# Patient Record
Sex: Female | Born: 1940 | Race: Black or African American | Hispanic: No | Marital: Single | Smoking: Never smoker
Health system: Southern US, Community
[De-identification: ages and names within clinical notes are randomized; demographics above are authoritative.]

## PROBLEM LIST (undated history)

## (undated) DIAGNOSIS — R079 Chest pain, unspecified: Secondary | ICD-10-CM

## (undated) DIAGNOSIS — F419 Anxiety disorder, unspecified: Secondary | ICD-10-CM

## (undated) DIAGNOSIS — F329 Major depressive disorder, single episode, unspecified: Secondary | ICD-10-CM

## (undated) DIAGNOSIS — I129 Hypertensive chronic kidney disease with stage 1 through stage 4 chronic kidney disease, or unspecified chronic kidney disease: Secondary | ICD-10-CM

## (undated) DIAGNOSIS — E785 Hyperlipidemia, unspecified: Secondary | ICD-10-CM

## (undated) DIAGNOSIS — N182 Chronic kidney disease, stage 2 (mild): Secondary | ICD-10-CM

## (undated) DIAGNOSIS — M810 Age-related osteoporosis without current pathological fracture: Secondary | ICD-10-CM

## (undated) DIAGNOSIS — N281 Cyst of kidney, acquired: Secondary | ICD-10-CM

## (undated) DIAGNOSIS — M8580 Other specified disorders of bone density and structure, unspecified site: Secondary | ICD-10-CM

## (undated) DIAGNOSIS — Z86718 Personal history of other venous thrombosis and embolism: Secondary | ICD-10-CM

## (undated) DIAGNOSIS — Z8709 Personal history of other diseases of the respiratory system: Secondary | ICD-10-CM

## (undated) DIAGNOSIS — F0390 Unspecified dementia without behavioral disturbance: Secondary | ICD-10-CM

## (undated) DIAGNOSIS — K5900 Constipation, unspecified: Secondary | ICD-10-CM

## (undated) DIAGNOSIS — R3915 Urgency of urination: Secondary | ICD-10-CM

## (undated) DIAGNOSIS — F32A Depression, unspecified: Secondary | ICD-10-CM

## (undated) DIAGNOSIS — M1990 Unspecified osteoarthritis, unspecified site: Secondary | ICD-10-CM

## (undated) DIAGNOSIS — M797 Fibromyalgia: Secondary | ICD-10-CM

## (undated) DIAGNOSIS — S2249XA Multiple fractures of ribs, unspecified side, initial encounter for closed fracture: Secondary | ICD-10-CM

## (undated) DIAGNOSIS — R310 Gross hematuria: Secondary | ICD-10-CM

## (undated) DIAGNOSIS — K649 Unspecified hemorrhoids: Secondary | ICD-10-CM

## (undated) DIAGNOSIS — C189 Malignant neoplasm of colon, unspecified: Secondary | ICD-10-CM

## (undated) DIAGNOSIS — E559 Vitamin D deficiency, unspecified: Secondary | ICD-10-CM

## (undated) DIAGNOSIS — H409 Unspecified glaucoma: Secondary | ICD-10-CM

## (undated) DIAGNOSIS — D649 Anemia, unspecified: Secondary | ICD-10-CM

## (undated) DIAGNOSIS — H9190 Unspecified hearing loss, unspecified ear: Secondary | ICD-10-CM

## (undated) DIAGNOSIS — M549 Dorsalgia, unspecified: Secondary | ICD-10-CM

## (undated) DIAGNOSIS — I700 Atherosclerosis of aorta: Secondary | ICD-10-CM

## (undated) DIAGNOSIS — I10 Essential (primary) hypertension: Secondary | ICD-10-CM

## (undated) DIAGNOSIS — Q631 Lobulated, fused and horseshoe kidney: Secondary | ICD-10-CM

## (undated) DIAGNOSIS — L659 Nonscarring hair loss, unspecified: Secondary | ICD-10-CM

## (undated) DIAGNOSIS — H547 Unspecified visual loss: Secondary | ICD-10-CM

## (undated) HISTORY — DX: Cyst of kidney, acquired: N28.1

## (undated) HISTORY — DX: Chronic kidney disease, stage 2 (mild): N18.2

## (undated) HISTORY — DX: Dorsalgia, unspecified: M54.9

## (undated) HISTORY — DX: Constipation, unspecified: K59.00

## (undated) HISTORY — DX: Urgency of urination: R39.15

## (undated) HISTORY — DX: Hypertensive chronic kidney disease with stage 1 through stage 4 chronic kidney disease, or unspecified chronic kidney disease: I12.9

## (undated) HISTORY — DX: Essential (primary) hypertension: I10

## (undated) HISTORY — DX: Gross hematuria: R31.0

## (undated) HISTORY — DX: Unspecified hemorrhoids: K64.9

## (undated) HISTORY — PX: BREAST SURGERY: SHX581

## (undated) HISTORY — DX: Unspecified hearing loss, unspecified ear: H91.90

## (undated) HISTORY — DX: Atherosclerosis of aorta: I70.0

## (undated) HISTORY — DX: Nonscarring hair loss, unspecified: L65.9

## (undated) HISTORY — DX: Major depressive disorder, single episode, unspecified: F32.9

## (undated) HISTORY — DX: Lobulated, fused and horseshoe kidney: Q63.1

## (undated) HISTORY — DX: Multiple fractures of ribs, unspecified side, initial encounter for closed fracture: S22.49XA

## (undated) HISTORY — PX: TOTAL ABDOMINAL HYSTERECTOMY: SHX209

## (undated) HISTORY — DX: Unspecified dementia, unspecified severity, without behavioral disturbance, psychotic disturbance, mood disturbance, and anxiety: F03.90

## (undated) HISTORY — DX: Personal history of other diseases of the respiratory system: Z87.09

## (undated) HISTORY — DX: Hyperlipidemia, unspecified: E78.5

## (undated) HISTORY — DX: Chest pain, unspecified: R07.9

## (undated) HISTORY — DX: Age-related osteoporosis without current pathological fracture: M81.0

## (undated) HISTORY — DX: Malignant neoplasm of colon, unspecified: C18.9

## (undated) HISTORY — DX: Unspecified glaucoma: H40.9

## (undated) HISTORY — PX: LUNG SURGERY: SHX703

## (undated) HISTORY — DX: Vitamin D deficiency, unspecified: E55.9

## (undated) HISTORY — DX: Anemia, unspecified: D64.9

## (undated) HISTORY — DX: Other specified disorders of bone density and structure, unspecified site: M85.80

## (undated) HISTORY — DX: Fibromyalgia: M79.7

## (undated) HISTORY — DX: Unspecified osteoarthritis, unspecified site: M19.90

## (undated) HISTORY — DX: Depression, unspecified: F32.A

## (undated) HISTORY — DX: Anxiety disorder, unspecified: F41.9

## (undated) HISTORY — DX: Personal history of other venous thrombosis and embolism: Z86.718

## (undated) HISTORY — DX: Unspecified visual loss: H54.7

---

## 1990-05-05 HISTORY — PX: BREAST EXCISIONAL BIOPSY: SUR124

## 1991-05-06 HISTORY — PX: BREAST EXCISIONAL BIOPSY: SUR124

## 1998-07-06 ENCOUNTER — Ambulatory Visit (HOSPITAL_COMMUNITY): Admission: RE | Admit: 1998-07-06 | Discharge: 1998-07-06 | Payer: Self-pay | Admitting: Family Medicine

## 1998-07-06 ENCOUNTER — Encounter: Payer: Self-pay | Admitting: Family Medicine

## 1999-10-07 ENCOUNTER — Encounter: Admission: RE | Admit: 1999-10-07 | Discharge: 1999-10-07 | Payer: Self-pay | Admitting: General Surgery

## 1999-10-07 ENCOUNTER — Encounter (HOSPITAL_BASED_OUTPATIENT_CLINIC_OR_DEPARTMENT_OTHER): Payer: Self-pay | Admitting: General Surgery

## 2000-04-21 ENCOUNTER — Encounter: Admission: RE | Admit: 2000-04-21 | Discharge: 2000-04-21 | Payer: Self-pay | Admitting: General Surgery

## 2000-04-21 ENCOUNTER — Encounter (HOSPITAL_BASED_OUTPATIENT_CLINIC_OR_DEPARTMENT_OTHER): Payer: Self-pay | Admitting: General Surgery

## 2000-10-11 ENCOUNTER — Emergency Department (HOSPITAL_COMMUNITY): Admission: EM | Admit: 2000-10-11 | Discharge: 2000-10-11 | Payer: Self-pay | Admitting: Emergency Medicine

## 2000-10-11 ENCOUNTER — Encounter: Payer: Self-pay | Admitting: Emergency Medicine

## 2001-06-04 ENCOUNTER — Encounter (HOSPITAL_BASED_OUTPATIENT_CLINIC_OR_DEPARTMENT_OTHER): Payer: Self-pay | Admitting: General Surgery

## 2001-06-04 ENCOUNTER — Encounter: Admission: RE | Admit: 2001-06-04 | Discharge: 2001-06-04 | Payer: Self-pay | Admitting: General Surgery

## 2002-10-31 ENCOUNTER — Ambulatory Visit (HOSPITAL_COMMUNITY): Admission: RE | Admit: 2002-10-31 | Discharge: 2002-10-31 | Payer: Self-pay | Admitting: Gastroenterology

## 2005-04-14 ENCOUNTER — Emergency Department (HOSPITAL_COMMUNITY): Admission: EM | Admit: 2005-04-14 | Discharge: 2005-04-14 | Payer: Self-pay | Admitting: Emergency Medicine

## 2005-12-05 ENCOUNTER — Ambulatory Visit (HOSPITAL_COMMUNITY): Admission: RE | Admit: 2005-12-05 | Discharge: 2005-12-05 | Payer: Self-pay | Admitting: Ophthalmology

## 2005-12-24 ENCOUNTER — Ambulatory Visit (HOSPITAL_COMMUNITY): Admission: RE | Admit: 2005-12-24 | Discharge: 2005-12-24 | Payer: Self-pay | Admitting: Ophthalmology

## 2006-01-16 ENCOUNTER — Encounter: Admission: RE | Admit: 2006-01-16 | Discharge: 2006-01-16 | Payer: Self-pay | Admitting: Internal Medicine

## 2008-06-14 ENCOUNTER — Ambulatory Visit: Payer: Self-pay | Admitting: Gastroenterology

## 2008-07-03 ENCOUNTER — Ambulatory Visit: Payer: Self-pay | Admitting: Gastroenterology

## 2009-04-10 ENCOUNTER — Encounter: Admission: RE | Admit: 2009-04-10 | Discharge: 2009-04-10 | Payer: Self-pay | Admitting: Internal Medicine

## 2009-05-29 ENCOUNTER — Inpatient Hospital Stay (HOSPITAL_COMMUNITY): Admission: EM | Admit: 2009-05-29 | Discharge: 2009-06-01 | Payer: Self-pay | Admitting: Emergency Medicine

## 2009-05-29 ENCOUNTER — Encounter: Admission: RE | Admit: 2009-05-29 | Discharge: 2009-08-27 | Payer: Self-pay | Admitting: Family Medicine

## 2009-07-05 ENCOUNTER — Encounter: Admission: RE | Admit: 2009-07-05 | Discharge: 2009-07-05 | Payer: Self-pay | Admitting: Internal Medicine

## 2009-08-16 ENCOUNTER — Encounter: Admission: RE | Admit: 2009-08-16 | Discharge: 2009-08-16 | Payer: Self-pay | Admitting: Internal Medicine

## 2009-08-27 ENCOUNTER — Encounter: Admission: RE | Admit: 2009-08-27 | Discharge: 2009-08-27 | Payer: Self-pay | Admitting: Internal Medicine

## 2010-05-27 ENCOUNTER — Encounter (HOSPITAL_BASED_OUTPATIENT_CLINIC_OR_DEPARTMENT_OTHER): Payer: Self-pay | Admitting: General Surgery

## 2010-07-21 LAB — URINALYSIS, ROUTINE W REFLEX MICROSCOPIC
Glucose, UA: NEGATIVE mg/dL
Ketones, ur: NEGATIVE mg/dL
Protein, ur: NEGATIVE mg/dL
pH: 8 (ref 5.0–8.0)

## 2010-07-21 LAB — CBC
HCT: 40.2 % (ref 36.0–46.0)
Hemoglobin: 13.4 g/dL (ref 12.0–15.0)
MCHC: 33.3 g/dL (ref 30.0–36.0)
MCV: 86.7 fL (ref 78.0–100.0)
Platelets: 173 10*3/uL (ref 150–400)
Platelets: 197 10*3/uL (ref 150–400)
RDW: 12.6 % (ref 11.5–15.5)
RDW: 12.7 % (ref 11.5–15.5)
WBC: 8.7 10*3/uL (ref 4.0–10.5)

## 2010-07-21 LAB — DIFFERENTIAL
Basophils Absolute: 0 10*3/uL (ref 0.0–0.1)
Eosinophils Absolute: 0.1 10*3/uL (ref 0.0–0.7)
Eosinophils Relative: 2 % (ref 0–5)
Lymphocytes Relative: 23 % (ref 12–46)
Monocytes Absolute: 0.5 10*3/uL (ref 0.1–1.0)

## 2010-07-21 LAB — BASIC METABOLIC PANEL
BUN: 12 mg/dL (ref 6–23)
BUN: 13 mg/dL (ref 6–23)
CO2: 22 mEq/L (ref 19–32)
Calcium: 8.7 mg/dL (ref 8.4–10.5)
Chloride: 105 mEq/L (ref 96–112)
GFR calc non Af Amer: 60 mL/min — ABNORMAL LOW (ref >60)
Glucose, Bld: 105 mg/dL — ABNORMAL HIGH (ref 70–99)
Glucose, Bld: 131 mg/dL — ABNORMAL HIGH (ref 70–99)
Potassium: 4.2 mEq/L (ref 3.5–5.1)
Sodium: 136 mEq/L (ref 135–145)

## 2010-07-21 LAB — URINE MICROSCOPIC-ADD ON

## 2010-07-21 LAB — PROTIME-INR: Prothrombin Time: 13 seconds (ref 11.6–15.2)

## 2010-07-21 LAB — GLUCOSE, CAPILLARY: Glucose-Capillary: 110 mg/dL — ABNORMAL HIGH (ref 70–99)

## 2010-07-21 LAB — MRSA PCR SCREENING: MRSA by PCR: NEGATIVE

## 2010-09-03 ENCOUNTER — Other Ambulatory Visit: Payer: Self-pay | Admitting: Internal Medicine

## 2010-09-03 DIAGNOSIS — Z1231 Encounter for screening mammogram for malignant neoplasm of breast: Secondary | ICD-10-CM

## 2010-09-20 ENCOUNTER — Ambulatory Visit
Admission: RE | Admit: 2010-09-20 | Discharge: 2010-09-20 | Disposition: A | Discharge status: Home / Self Care | Payer: PRIVATE HEALTH INSURANCE | Source: Ambulatory Visit | Attending: Internal Medicine | Admitting: Internal Medicine

## 2010-09-20 DIAGNOSIS — Z1231 Encounter for screening mammogram for malignant neoplasm of breast: Secondary | ICD-10-CM

## 2010-09-20 NOTE — Op Note (Signed)
[name], Kimberly Mclean                             ACCOUNT NO.:  [id number]   MEDICAL RECORD NO.:  [id number]                   PATIENT TYPE:  AMB   LOCATION:  ENDO                                 FACILITY:  [facility]   PHYSICIAN:  Anselmo Rod, M.D.               DATE OF BIRTH:  1940-09-17   DATE OF PROCEDURE:  10/31/2002  DATE OF DISCHARGE:                                 OPERATIVE REPORT   PROCEDURE PERFORMED:  Screening colonoscopy.   ENDOSCOPIST:  Anselmo Rod, M.D.   INSTRUMENT USED:  Olympus videocolonoscope.   INDICATION FOR THE PROCEDURE:  A 70 year old African-American female who is  undergoing a screening colonoscopy because of a family history of colon  cancer in a brother and changing bowel habits, rule out colonic polyps,  masses, etc.   PREPROCEDURE PREPARATION:  Informed consent was procured from the patient.  The patient was fasted for eight hours prior to the procedure and prepped  with a bottle of magnesium citrate and a gallon on GoLYTELY the night prior  to the procedure.   PREPROCEDURE PHYSICAL:  VITAL SIGNS:  Stable.  NECK:  Supple.  CHEST:  Clear to auscultation.  S1 and S2 regular.  ABDOMEN:  Soft with normal bowel sounds.   DESCRIPTION OF THE PROCEDURE:  The patient was placed in the left lateral  decubitus position and sedated with 60 mg of Demerol and  5 mg Versed  intravenously.  Once the patient was adequately sedated and maintained on  low-flow oxygen and continuous cardiac monitoring, the Olympus  videocolonoscope was advanced from the rectum to the cecum and terminal  ileum without difficulty.  Except for small hemorrhoids seen on  retroflexion, no other abnormalities were noted.  No masses, polyps,  erosions, ulcerations, or diverticula were present.  The appendiceal orifice  and ileocecal valve were clearly visualized and photographed.  The terminal  ileum appeared normal.   IMPRESSION:  Normal colonoscopy up to the terminal ileum except  for small  internal hemorrhoids.   RECOMMENDATIONS:  1. A high fiber diet has been discussed with the patient.  Brochures have     been given to her for education.  2.     Repeat colorectal cancer screening is recommended in the next five years     unless the patient develops any abnormal symptoms in the interim.  3. Outpatient followup in the next two weeks or earlier if need be.                                               Anselmo Rod, M.D.    JNM/MEDQ  D:  10/31/2002  T:  10/31/2002  Job:  [id number]   cc:   Leonie Man, M.D.  [address],  [address]  [place]  [place] [address]  Fax: [phone]   Stacie Acres. White, M.D.  [address]  [place]  [place] [address]  Fax: [phone]

## 2010-09-20 NOTE — Op Note (Signed)
Kimberly Mclean, Kimberly Mclean NO.:  [id number]   MEDICAL RECORD NO.:  [id number]          PATIENT TYPE:  AMB   LOCATION:  SDS                          FACILITY:  [facility]   PHYSICIAN:  Salley Scarlet., M.D.DATE OF BIRTH:  08-08-1940   DATE OF PROCEDURE:  12/08/2005  DATE OF DISCHARGE:  12/05/2005                                 OPERATIVE REPORT   PREOPERATIVE DIAGNOSIS:  Immature cataract, right eye.   POSTOPERATIVE DIAGNOSIS:  Immature cataract, right eye.   OPERATION:  Kelman phacoemulsification cataract, right eye.   ANESTHESIA:  Local using Xylocaine 2% with Marcaine 0.75% and Vitrase.   JUSTIFICATION FOR PROCEDURE:  This is a 70 year old lady who complains  blurring of vision with difficulty seeing to read.  She was evaluated and  found to have visual acuity best corrected 20/60 on the right and 20/50 on  the left.  There were bilateral immature cataracts worse on the right than  the left.  Cataract extraction with intraocular lens implantation was  recommended.  She is admitted at this time that purpose.   PROCEDURE:  Under influence of IV sedation, Van Lint akinesia and  retrobulbar anesthesia was given.  The patient was prepped and draped in the  usual manner.  Lid speculum was inserted under the upper and lower lid of  the right eye and a 4-0 silk traction suture was passed through the belly of  the superior rectus muscle for traction.  A fornix-based conjunctival flap  was turned and hemostasis achieved using cautery.  An incision made in the  sclera at the limbus.  This incision was dissected down to clear cornea  using crescent blade.  A sideport incision made at the 1:30 o'clock  position.  OcuCoat was injected into the eye through the side port incision.  The anterior chamber was entered through the corneoscleral tunnel incision  at 11:30 position.  An anterior capsulotomy was done using a bent 25 gauge  needle.  The nucleus was hydrodissected using  Xylocaine.  The KPE handpiece  was passed into the eye and the nucleus was begun to be emulsified.  The  pupil also became constricted and quite miotic.  During the emulsification  there was noted that there was a tear in the posterior capsule.  Therefore  the procedure was converted to an extracapsular procedure.  The  corneoscleral wound was then extended first toward the left then toward the  right placing a single 8-0 Vicryl suture across each arm of the incision as  it was made respectively toward the left and toward the right.  The nucleus  was then manually expressed from the eye and the cortical material was  aspirated using the IA handpiece.  The vitreous presented itself in the  wound and a deep anterior vitrectomy was done.  After making sure that the  vitreous was out of the anterior chamber, the anterior chamber was formed  and the pupil constricted using Miochol.  The anterior chamber lens was then  seated across the iris and chamber angle and rotated in a horizontal  fashion.  A peripheral iridectomy was made in the iris.  The corneoscleral  wound was closed using a combination of interrupted sutures of 8-0 Vicryl  and 10-0 nylon.  After it was ascertained that the wound was airtight and  watertight, the conjunctiva was closed using thermal cautery.  1 mL of  Celestone 0.5 mL of gentamicin were injected subconjunctivally.  Maxitrol  ophthalmic ointment and atropine drops were applied along with a patch and  Fox shield.  The patient tolerated the procedure well, was discharged to the  post anesthesia recovery room in  satisfactory condition.  She is instructed to rest today to take Vicodin  every 4 hours as needed for pain and to see me in the office tomorrow for  further evaluation.   DISCHARGE DIAGNOSIS:  Immature cataract, right eye.      Salley Scarlet., M.D.  Electronically Signed     TB/MEDQ  D:  12/08/2005  T:  12/09/2005  Job:  [id number]

## 2010-09-20 NOTE — Op Note (Signed)
NAMEAMRITHA, Kimberly Mclean NO.:  [id number]   MEDICAL RECORD NO.:  [id number]          PATIENT TYPE:  AMB   LOCATION:  SDS                          FACILITY:  [facility]   PHYSICIAN:  Salley Scarlet., M.D.DATE OF BIRTH:  25-Aug-1940   DATE OF PROCEDURE:  12/24/2005  DATE OF DISCHARGE:  12/24/2005                                 OPERATIVE REPORT   PREOPERATIVE DIAGNOSES:  1. Status postop cataract extraction right eye.  2. Wound dehiscence right eye.   OPERATION:  Repair wound dehiscence.   ANESTHESIA:  Local using Xylocaine 2%, Marcaine 0.75%, and Vitrase.   JUSTIFICATION FOR PROCEDURE:  This is a 70 year old lady who underwent a  cataract extraction on December 05, 2005.  That procedure was complicated by  posterior capsule rupture and vitreous loss.  An anterior vitrectomy was  done, and an anterior chamber intraocular lens was implanted.  She had done  well postoperatively until she presented to the office on December 24, 2005,  where she was found to have iris prolapse through an open surgical wound at  the 1 o'clock position.  The the patient was counseled and advised of the  nature of the situation.  She was advised that we needed to admit her  immediately for repair of the wound dehiscence.  She is therefore admitted  at this time for that purpose.   PROCEDURE:  Under the influence of IV sedation, a Van Lint akinesia and  retrobulbar anesthesia was given.  The patient was prepped and draped in the  usual manner.  The lid speculum was inserted under the upper and lower lid  of the right eye.  The wound was inspected, and there was a large knuckle of  iris prolapsed through the wound from approximately the 1 to the 2:30  o'clock position.  A small conjunctival flap was raised over the wound, and  hemostasis was achieved by using the cautery.  The iris was gently teased  back into the eye through the surgical wound.  The anterior chamber was  reformed and deepened  by using Miochol.  An 8-0 Vicryl suture was placed  across the lips of the corneoscleral wound and tied securely.  The wound was  tested to make sure that there was no leak.  After ascertaining there was no  leak, the conjunctiva was closed over wound using thermal cautery.  One cc  of Celestone and 0.5 cc of gentamicin were injected subconjunctivally.  Maxitrol ophthalmic ointment and Pilopine normal and atropine drops were  applied along with a patch and Fox shield.  The patient tolerated the  procedure well and was discharged to postanesthesia recovery in satisfaction  vision.  She is instructed to rest today, to take Vicodin q.4 h. as needed  for pain, and to see me in the office tomorrow for further evaluation.   DISCHARGE DIAGNOSES:  1. Status postop cataract extraction right eye.  2. Wound dehiscence right eye.     Salley Scarlet., M.D.  Electronically Signed    TB/MEDQ  D:  11-30-2005  T:  12/26/2005  Job:  [id number] 

## 2011-11-13 ENCOUNTER — Telehealth: Payer: Self-pay | Admitting: Gastroenterology

## 2011-11-14 NOTE — Telephone Encounter (Signed)
Pt had a COLON in 2010 and hasn't been seen since. She is having problems with her hemorrhoids and wants to be seen, but will not have her co pay until the end of the month. Her PCP gave her Proctsol BID, but it hasn't helped. Can we order her something before her visit? Thanks.

## 2011-11-14 NOTE — Telephone Encounter (Signed)
Needs ov

## 2011-11-17 NOTE — Telephone Encounter (Signed)
Informed pt she needs an OV. She will try Sitz baths prn and call in when she gets her check for an appt.

## 2011-12-01 ENCOUNTER — Other Ambulatory Visit: Payer: Self-pay | Admitting: Internal Medicine

## 2011-12-01 DIAGNOSIS — Z1231 Encounter for screening mammogram for malignant neoplasm of breast: Secondary | ICD-10-CM

## 2011-12-16 ENCOUNTER — Ambulatory Visit
Admission: RE | Admit: 2011-12-16 | Discharge: 2011-12-16 | Disposition: A | Discharge status: Home / Self Care | Payer: PRIVATE HEALTH INSURANCE | Source: Ambulatory Visit | Attending: Internal Medicine | Admitting: Internal Medicine

## 2011-12-16 DIAGNOSIS — Z1231 Encounter for screening mammogram for malignant neoplasm of breast: Secondary | ICD-10-CM

## 2012-01-14 ENCOUNTER — Telehealth: Payer: Self-pay | Admitting: Gastroenterology

## 2012-01-15 NOTE — Telephone Encounter (Signed)
Pt had called on 11/13/11 for the same problems and was reluctant to come in and decided she would do the sitz baths and use the Protosol. She is still do the same routine, but has terrible rectal pain and some blood on the tissue when she wipes. She was encouraged to use baby wipes and will see Mike Gip, PA tomorrow.

## 2012-01-16 ENCOUNTER — Encounter: Payer: Self-pay | Admitting: Physician Assistant

## 2012-01-16 ENCOUNTER — Ambulatory Visit (INDEPENDENT_AMBULATORY_CARE_PROVIDER_SITE_OTHER): Payer: PRIVATE HEALTH INSURANCE | Admitting: Physician Assistant

## 2012-01-16 VITALS — BP 140/76 | HR 72 | Ht 62.0 in | Wt 135.4 lb

## 2012-01-16 DIAGNOSIS — I10 Essential (primary) hypertension: Secondary | ICD-10-CM

## 2012-01-16 DIAGNOSIS — K649 Unspecified hemorrhoids: Secondary | ICD-10-CM

## 2012-01-16 DIAGNOSIS — K5730 Diverticulosis of large intestine without perforation or abscess without bleeding: Secondary | ICD-10-CM

## 2012-01-16 DIAGNOSIS — K5790 Diverticulosis of intestine, part unspecified, without perforation or abscess without bleeding: Secondary | ICD-10-CM

## 2012-01-16 MED ORDER — LIDOCAINE HCL 2 % EX GEL: Freq: Two times a day (BID) | CUTANEOUS | Status: AC

## 2012-01-16 NOTE — Progress Notes (Signed)
Agree with initial assessment and plans as outlined 

## 2012-01-16 NOTE — Patient Instructions (Addendum)
We have sent in a prescription to your pharmacy Use three times a day along with your Anusol cream  Follow up as needed

## 2012-01-16 NOTE — Progress Notes (Signed)
Subjective:    Patient ID: Kimberly Mclean, female    DOB: 1940-09-24, 71 y.o.   MRN: [id number]  HPI Kimberly Mclean is a very nice 71 year old African American female known to Dr. Jarold Motto from prior colonoscopy done in March of 2010. She was noted to have mild left-sided diverticulosis and otherwise a normal exam. Day with complaints of rectal pain and intermittent rectal bleeding. She says she has had intermittent symptoms over the past several years but comes in today because she has persistent    rectal discomfort over the past few months which does not seem to be relieved with Anusol-HC. She says she doesn't some trouble with constipation and uses a magnesium as needed with good results. She has no complaints of abdominal pain or discomfort or changes in her bowel habits. She's complaining of rectal pain which she feels is external and aggravated by bowel movements. She says she sees small amount of bright red blood on the tissue intermittently.    Review of Systems  Constitutional: Negative.   HENT: Negative.   Eyes: Negative.   Respiratory: Negative.   Cardiovascular: Negative.   Gastrointestinal: Positive for blood in stool and rectal pain.  Genitourinary: Negative.   Musculoskeletal: Negative.   Neurological: Negative.   Hematological: Negative.   Psychiatric/Behavioral: Negative.    Outpatient Encounter Prescriptions as of 01/16/2012  Medication Sig Dispense Refill  . Alendronate Sodium (FOSAMAX PO) Take by mouth.      . brimonidine (ALPHAGAN P) 0.1 % SOLN       . CALCIUM & MAGNESIUM CARBONATES PO Take by mouth.      . Cholecalciferol (VITAMIN D3) 2000 UNITS TABS Take by mouth.      . dorzolamide (TRUSOPT) 2 % ophthalmic solution 1 drop 3 (three) times daily.      . hydrocortisone (ANUSOL-HC) 2.5 % rectal cream Place rectally 2 (two) times daily.      Marland Kitchen latanoprost (XALATAN) 0.005 % ophthalmic solution 1 drop at bedtime.      . simvastatin (ZOCOR) 40 MG tablet Take 40 mg by mouth every  evening.      . Telmisartan (MICARDIS PO) Take by mouth.      . lidocaine (XYLOCAINE) 2 % jelly Apply topically 2 (two) times daily.  30 mL  1       No Known Allergies Patient Active Problem List  Diagnosis  . Diverticulosis  . Hemorrhoids  . HTN (hypertension)   History   Social History  . Marital Status: Single    Spouse Name: N/A    Number of Children: N/A  . Years of Education: N/A   Occupational History  . Not on file.   Social History Main Topics  . Smoking status: Never Smoker   . Smokeless tobacco: Never Used  . Alcohol Use: No  . Drug Use: No  . Sexually Active: Not on file   Other Topics Concern  . Not on file   Social History Narrative   No regular caffeine    Objective:   Physical Exam well-developed older AA female in no acute distress, pleasant blood pressure 140/76 pulse 72. HEENT; nontraumatic normocephalic EOMI PERRLA sclera anicteric, Supple no JVD, Cardiovascular; regular rate and rhythm with S1-S2 no murmur or gallop, Pulmonary; clear bilaterally, Abdomen; soft nontender nondistended bowel sounds are active she has a lower midline incisional scar, Rectal ;external hemorrhoids one of which has a hypertrophied anal papillae, on anoscopy she has one small inflamed internal hemorrhoid. No other abnormalities noted, Extremities; no clubbing  cyanosis or edema and warm dry, Psych; mood and affect normal and appropriate.        Assessment & Plan:  #59 71 year old female with rectal discomfort and small volume hematochezia secondary to internal and external hemorrhoid, there is no significant edema and no thrombosis. #2 diverticulosis  Plan; rectal care reviewed, she said advised to try moistened tissue and continue milk of magnesia or magnesium supplements as needed to keep her stool soft. Will continue Anusol HC3 to 4 times daily both internally and externally over the next couple of weeks and we'll add  lidocaine gel 2%- 3 times daily x2 weeks and then  as needed . I reassured her that there was no other problem. We briefly discussed surgical referral if her symptoms not settle down.

## 2012-01-20 ENCOUNTER — Telehealth: Payer: Self-pay | Admitting: Gastroenterology

## 2012-01-21 ENCOUNTER — Telehealth: Payer: Self-pay | Admitting: Physician Assistant

## 2012-01-21 NOTE — Telephone Encounter (Signed)
Patient was not sure if the Lidocaine was to be used externally or internally. Discussed with patient that it was externally.

## 2012-01-21 NOTE — Telephone Encounter (Signed)
Patient already helped by Mike Gip. See Phone encounter of 01-21-12.

## 2012-10-03 ENCOUNTER — Encounter (HOSPITAL_COMMUNITY): Payer: Self-pay | Admitting: *Deleted

## 2012-10-03 ENCOUNTER — Emergency Department (INDEPENDENT_AMBULATORY_CARE_PROVIDER_SITE_OTHER)
Admission: EM | Admit: 2012-10-03 | Discharge: 2012-10-03 | Disposition: A | Discharge status: Home / Self Care | Payer: Medicare Other | Source: Home / Self Care | Attending: Family Medicine | Admitting: Family Medicine

## 2012-10-03 DIAGNOSIS — I10 Essential (primary) hypertension: Secondary | ICD-10-CM

## 2012-10-03 LAB — POCT URINALYSIS DIP (DEVICE)
Bilirubin Urine: NEGATIVE
Leukocytes, UA: NEGATIVE
Nitrite: NEGATIVE
Protein, ur: NEGATIVE mg/dL
Urobilinogen, UA: 0.2 mg/dL (ref 0.0–1.0)
pH: 7 (ref 5.0–8.0)

## 2012-10-03 LAB — POCT I-STAT, CHEM 8
Calcium, Ion: 1.31 mmol/L — ABNORMAL HIGH (ref 1.13–1.30)
Chloride: 107 mEq/L (ref 96–112)
Glucose, Bld: 98 mg/dL (ref 70–99)
HCT: 45 % (ref 36.0–46.0)
TCO2: 30 mmol/L (ref 0–100)

## 2012-10-03 NOTE — ED Provider Notes (Signed)
History     CSN: [id number]  Arrival date & time 10/03/12  1110   First MD Initiated Contact with Patient 10/03/12 1149      Chief Complaint  Patient presents with  . Hypertension    (Consider location/radiation/quality/duration/timing/severity/associated sxs/prior treatment) HPI Comments: Pt presents c/o high blood pressure.  She has been checking her BP daily for the past week and systolic has been up in the 170s every time.  She is also c/o weight loss (2 lb in 4-5 months) and pain in her hands for the past month.  She states she saw her PCP a couple of days ago and they did not adjust her Rx despite her BP being high so she would like [place] to do something for her.  No chest pain, headache, SOB.    Patient is a 72 y.o. female presenting with hypertension.  Hypertension Pertinent negatives include no chest pain, no abdominal pain and no shortness of breath.    Past Medical History  Diagnosis Date  . Hypertension   . Hyperlipidemia   . Osteopenia   . Glaucoma     right eye    Past Surgical History  Procedure Laterality Date  . Breast surgery  1970's    removed 2 benign lumps  . Total abdominal hysterectomy    . Lung surgery      left lung punctured-surgically repaired    Family History  Problem Relation Age of Onset  . Prostate cancer Father   . Hypertension Mother     brother also  . Colon cancer Neg Hx   . Lung cancer Sister   . Emphysema Sister     History  Substance Use Topics  . Smoking status: Never Smoker   . Smokeless tobacco: Never Used  . Alcohol Use: No    OB History   Grav Para Term Preterm Abortions TAB SAB Ect Mult Living                  Review of Systems  Constitutional: Positive for unexpected weight change (loss of 2 lb over 5 mos ). Negative for fever and chills.  Eyes: Negative for visual disturbance.  Respiratory: Negative for cough and shortness of breath.   Cardiovascular: Negative for chest pain, palpitations and leg swelling.        High blood pressure   Gastrointestinal: Negative for nausea, vomiting and abdominal pain.  Endocrine: Negative for polydipsia and polyuria.  Genitourinary: Negative for dysuria, urgency and frequency.  Musculoskeletal: Negative for myalgias and arthralgias.       Pain in hands   Skin: Negative for rash.  Neurological: Negative for dizziness, weakness and light-headedness.    Allergies  Review of patient's allergies indicates no known allergies.  Home Medications   Current Outpatient Rx  Name  Route  Sig  Dispense  Refill  . Alendronate Sodium (FOSAMAX PO)   Oral   Take by mouth.         . brimonidine (ALPHAGAN P) 0.1 % SOLN               . dorzolamide (TRUSOPT) 2 % ophthalmic solution      1 drop 3 (three) times daily.         Marland Kitchen latanoprost (XALATAN) 0.005 % ophthalmic solution      1 drop at bedtime.         Marland Kitchen losartan (COZAAR) 100 MG tablet   Oral   Take 100 mg by mouth daily.         Marland Kitchen  simvastatin (ZOCOR) 40 MG tablet   Oral   Take 40 mg by mouth every evening.         Marland Kitchen CALCIUM & MAGNESIUM CARBONATES PO   Oral   Take by mouth.         . Cholecalciferol (VITAMIN D3) 2000 UNITS TABS   Oral   Take by mouth.         . hydrocortisone (ANUSOL-HC) 2.5 % rectal cream   Rectal   Place rectally 2 (two) times daily.         Marland Kitchen lidocaine (XYLOCAINE) 2 % jelly   Topical   Apply topically 2 (two) times daily.   30 mL   1   . Telmisartan (MICARDIS PO)   Oral   Take by mouth.           BP 176/83  Pulse 51  Temp(Src) 97.8 F (36.6 C) (Oral)  Resp 16  SpO2 100%  Physical Exam  Nursing note and vitals reviewed. Constitutional: She is oriented to person, place, and time. Vital signs are normal. She appears well-developed and well-nourished. No distress.  HENT:  Head: Atraumatic.  Eyes: EOM are normal. Pupils are equal, round, and reactive to light.  Cardiovascular: Normal rate, regular rhythm and normal heart sounds.  Exam  reveals no gallop and no friction rub.   No murmur heard. Pulmonary/Chest: Effort normal and breath sounds normal. No respiratory distress. She has no wheezes. She has no rales.  Musculoskeletal: Normal range of motion. She exhibits no edema and no tenderness.  Neurological: She is alert and oriented to person, place, and time. She has normal strength. No cranial nerve deficit. Coordination normal.  Skin: Skin is warm and dry. She is not diaphoretic.  Psychiatric: She has a normal mood and affect. Her behavior is normal. Judgment normal.    ED Course  Procedures (including critical care time)  Labs Reviewed  POCT URINALYSIS DIP (DEVICE) - Abnormal; Notable for the following:    Hgb urine dipstick TRACE (*)    All other components within normal limits  POCT I-STAT, CHEM 8 - Abnormal; Notable for the following:    Calcium, Ion 1.31 (*)    Hemoglobin 15.3 (*)    All other components within normal limits   No results found.   1. HTN (hypertension)       MDM  No indications of instability of EOD resulting from HTN.  Explained to pt that management of HTN needs to be done through PCP.  For the time being, she needs to continue to take her medications as prescribed and f/u with her PCP regarding management of HTN.  Gave pt a list of PCP providers that she can call if she is not satisfied with her current provider.          Graylon Good, PA-C 10/03/12 1351

## 2012-10-03 NOTE — ED Notes (Signed)
Patient states that she has "pressure" in her hands with pain due to her blood pressure; states numbness and pressure in hands x 1 month.

## 2012-10-03 NOTE — ED Provider Notes (Signed)
Medical screening examination/treatment/procedure(s) were performed by resident physician or non-physician practitioner and as supervising physician I was immediately available for consultation/collaboration.   KINDL,JAMES DOUGLAS MD.   James D Kindl, MD 10/03/12 1848 

## 2012-10-08 ENCOUNTER — Encounter (INDEPENDENT_AMBULATORY_CARE_PROVIDER_SITE_OTHER): Payer: Self-pay | Admitting: Surgery

## 2012-10-19 ENCOUNTER — Ambulatory Visit (INDEPENDENT_AMBULATORY_CARE_PROVIDER_SITE_OTHER): Payer: Medicare Other | Admitting: Surgery

## 2012-10-19 ENCOUNTER — Encounter (INDEPENDENT_AMBULATORY_CARE_PROVIDER_SITE_OTHER): Payer: Self-pay | Admitting: Surgery

## 2012-10-19 VITALS — BP 126/64 | HR 82 | Temp 97.6°F | Resp 14 | Ht 63.0 in | Wt 129.2 lb

## 2012-10-19 DIAGNOSIS — K649 Unspecified hemorrhoids: Secondary | ICD-10-CM

## 2012-10-19 NOTE — Patient Instructions (Signed)
Daily fiber supplement (Metamucil, fibercon, benefiber) Daily stool softener (Colace) Miralax as needed for constipation

## 2012-10-19 NOTE — Progress Notes (Signed)
Patient ID: Kimberly Mclean, female   DOB: [birth year], 72 y.o.   MRN: [id number]  Chief Complaint  Patient presents with  . New Evaluation    eval hems    HPI Kimberly Mclean is a 72 y.o. female. Referred by Dr. Dorothyann Peng for evaluation of hemorrhoids  HPI This is a 72 year old female who presents with several years of hemorrhoids. The patient has chronic problems with bowel movements. She does have some chronic constipation. She has seen GI and they started her on samples of Align.  This seemed to improve her bowel movements lately. She denies any bleeding. She does have some tenderness and irritation externally.  She presents now to discuss possible surgical treatment.   Past Medical History  Diagnosis Date  . Hypertension   . Hyperlipidemia   . Osteopenia   . Glaucoma     right eye  . Osteoporosis     Past Surgical History  Procedure Laterality Date  . Breast surgery  1970's    removed 2 benign lumps  . Total abdominal hysterectomy    . Lung surgery      left lung punctured-surgically repaired    Family History  Problem Relation Age of Onset  . Prostate cancer Father   . Hypertension Mother     brother also  . Colon cancer Neg Hx   . Lung cancer Sister   . Emphysema Sister     Social History History  Substance Use Topics  . Smoking status: Never Smoker   . Smokeless tobacco: Never Used  . Alcohol Use: No    No Known Allergies  Current Outpatient Prescriptions  Medication Sig Dispense Refill  . Alendronate Sodium (FOSAMAX PO) Take by mouth.      . brimonidine (ALPHAGAN P) 0.1 % SOLN       . CALCIUM & MAGNESIUM CARBONATES PO Take by mouth.      . Cholecalciferol (VITAMIN D3) 2000 UNITS TABS Take by mouth.      . dorzolamide (TRUSOPT) 2 % ophthalmic solution 1 drop 3 (three) times daily.      . hydrocortisone (ANUSOL-HC) 2.5 % rectal cream Place rectally 2 (two) times daily.      Marland Kitchen latanoprost (XALATAN) 0.005 % ophthalmic solution 1 drop at bedtime.      .  lidocaine (XYLOCAINE) 2 % jelly Apply topically 2 (two) times daily.  30 mL  1  . losartan (COZAAR) 100 MG tablet Take 100 mg by mouth daily.      . simvastatin (ZOCOR) 40 MG tablet Take 40 mg by mouth every evening.      . Telmisartan (MICARDIS PO) Take by mouth.       No current facility-administered medications for this visit.    Review of Systems Review of Systems  Constitutional: Negative for fever, chills and unexpected weight change.  HENT: Negative for hearing loss, congestion, sore throat, trouble swallowing and voice change.   Eyes: Negative for visual disturbance.  Respiratory: Negative for cough and wheezing.   Cardiovascular: Negative for chest pain, palpitations and leg swelling.  Gastrointestinal: Positive for constipation and rectal pain. Negative for nausea, vomiting, abdominal pain, diarrhea, blood in stool, abdominal distention and anal bleeding.  Genitourinary: Negative for hematuria, vaginal bleeding and difficulty urinating.  Musculoskeletal: Negative for arthralgias.  Skin: Negative for rash and wound.  Neurological: Negative for seizures, syncope and headaches.  Hematological: Negative for adenopathy. Does not bruise/bleed easily.  Psychiatric/Behavioral: Negative for confusion.    Blood pressure 126/64, pulse 82,  temperature 97.6 F (36.4 C), temperature source Temporal, resp. rate 14, height 5\' 3"  (1.6 m), weight 129 lb 3.2 oz (58.605 kg).  Physical Exam Physical Exam  WDWN in NAD Rectal - loose external hemorrhoidal skin tags; no thrombosis or inflammation; no significant internal hemorrhoid disease No visible fissures  Data Reviewed none  Assessment    External hemorrhoids - likely secondary to chronic constipation     Plan    Continue probiotic Daily fiber supplement/ stool softeners Miralax as needed Sitz baths PRN Recheck in one month.        [name]. 10/19/2012, 3:11 PM

## 2012-11-01 ENCOUNTER — Encounter (HOSPITAL_COMMUNITY): Payer: Self-pay | Admitting: Emergency Medicine

## 2012-11-01 DIAGNOSIS — E785 Hyperlipidemia, unspecified: Secondary | ICD-10-CM | POA: Insufficient documentation

## 2012-11-01 DIAGNOSIS — T148 Other injury of unspecified body region: Secondary | ICD-10-CM | POA: Insufficient documentation

## 2012-11-01 DIAGNOSIS — Y939 Activity, unspecified: Secondary | ICD-10-CM | POA: Insufficient documentation

## 2012-11-01 DIAGNOSIS — S00209A Unspecified superficial injury of unspecified eyelid and periocular area, initial encounter: Secondary | ICD-10-CM | POA: Insufficient documentation

## 2012-11-01 DIAGNOSIS — M810 Age-related osteoporosis without current pathological fracture: Secondary | ICD-10-CM | POA: Insufficient documentation

## 2012-11-01 DIAGNOSIS — I10 Essential (primary) hypertension: Secondary | ICD-10-CM | POA: Insufficient documentation

## 2012-11-01 DIAGNOSIS — Y929 Unspecified place or not applicable: Secondary | ICD-10-CM | POA: Insufficient documentation

## 2012-11-01 DIAGNOSIS — W57XXXA Bitten or stung by nonvenomous insect and other nonvenomous arthropods, initial encounter: Secondary | ICD-10-CM | POA: Insufficient documentation

## 2012-11-01 DIAGNOSIS — Z79899 Other long term (current) drug therapy: Secondary | ICD-10-CM | POA: Insufficient documentation

## 2012-11-01 DIAGNOSIS — H409 Unspecified glaucoma: Secondary | ICD-10-CM | POA: Insufficient documentation

## 2012-11-01 NOTE — ED Notes (Signed)
PT. REPORTS STUNG BY A BEE AT RIGHT EYELID THIS AFTERNOON , SLIGHT SWELLING / NO DRAINAGE . RESPIRATIONS UNLABORED / AIRWAY INTACT.

## 2012-11-02 ENCOUNTER — Emergency Department (HOSPITAL_COMMUNITY)
Admission: EM | Admit: 2012-11-02 | Discharge: 2012-11-02 | Disposition: A | Discharge status: Home / Self Care | Payer: Medicare Other | Attending: Emergency Medicine | Admitting: Emergency Medicine

## 2012-11-02 MED ORDER — IBUPROFEN 400 MG PO TABS
400.0000 mg | ORAL_TABLET | Freq: Once | ORAL | Status: AC
  Administered 2012-11-02: 400 mg via ORAL
  Filled 2012-11-02: qty 1

## 2012-11-02 NOTE — ED Notes (Addendum)
Pt is insistent that there is swelling and redness in the right eye; will let PA observe right eye

## 2012-11-02 NOTE — ED Provider Notes (Signed)
Medical screening examination/treatment/procedure(s) were performed by non-physician practitioner and as supervising physician I was immediately available for consultation/collaboration.   Laray Anger, DO 11/02/12 1308

## 2012-11-02 NOTE — ED Provider Notes (Signed)
History    CSN: [id number] Arrival date & time 11/01/12  2029  First MD Initiated Contact with Patient 11/02/12 0041     Chief Complaint  Patient presents with  . Eye Pain   HPI  History provided by the patient. Patient is a 72 year old female with history of hypertension hyperlipidemia and glaucoma of the right eye with previous surgeries presents with complaints of wasp sting to her right eyelid. Patient states she was out on her porch around 5 PM when she suddenly had a sharp pain to her eyelid. She reports brushing and knocking down a wasp which landed on the ground. She was able to step until the loss but had sharp pain to her eyelid. Since that time pain has been improving however she grew concerned that she may have a stinger in her eyelid still. She has not used any treatments for her pain. She denies any changes in her vision. She states she normally has very poor blurry vision of the right eye from her previous surgeries. She denies pain with movement of her. Denies any photophobia. No drainage or tearing of the eyes. No other rash or itching of the skin. No difficulty breathing or swallowing.   Past Medical History  Diagnosis Date  . Hypertension   . Hyperlipidemia   . Osteopenia   . Glaucoma     right eye  . Osteoporosis    Past Surgical History  Procedure Laterality Date  . Breast surgery  1970's    removed 2 benign lumps  . Total abdominal hysterectomy    . Lung surgery      left lung punctured-surgically repaired   Family History  Problem Relation Age of Onset  . Prostate cancer Father   . Hypertension Mother     brother also  . Colon cancer Neg Hx   . Lung cancer Sister   . Emphysema Sister    History  Substance Use Topics  . Smoking status: Never Smoker   . Smokeless tobacco: Never Used  . Alcohol Use: No   OB History   Grav Para Term Preterm Abortions TAB SAB Ect Mult Living                 Review of Systems  Eyes: Positive for pain. Negative  for photophobia, discharge, redness, itching and visual disturbance.  All other systems reviewed and are negative.    Allergies  Review of patient's allergies indicates no known allergies.  Home Medications   Current Outpatient Rx  Name  Route  Sig  Dispense  Refill  . Alendronate Sodium (FOSAMAX PO)   Oral   Take 1 tablet by mouth once a week.          . brimonidine (ALPHAGAN P) 0.1 % SOLN   Right Eye   Place 1 drop into the right eye daily.          Marland Kitchen CALCIUM & MAGNESIUM CARBONATES PO   Oral   Take 1 tablet by mouth daily.          . Cholecalciferol (VITAMIN D3) 2000 UNITS TABS   Oral   Take 1 tablet by mouth daily.          . dorzolamide (TRUSOPT) 2 % ophthalmic solution   Right Eye   Place 1 drop into the right eye 3 (three) times daily.          . hydrocortisone (ANUSOL-HC) 2.5 % rectal cream   Rectal   Place rectally 2 (  two) times daily.         Marland Kitchen latanoprost (XALATAN) 0.005 % ophthalmic solution   Right Eye   Place 1 drop into the right eye at bedtime.          . lidocaine (XYLOCAINE) 2 % jelly   Topical   Apply topically 2 (two) times daily.   30 mL   1   . losartan (COZAAR) 100 MG tablet   Oral   Take 100 mg by mouth daily.         . simvastatin (ZOCOR) 40 MG tablet   Oral   Take 40 mg by mouth every evening.         . Telmisartan (MICARDIS PO)   Oral   Take by mouth.          BP 145/72  Pulse 77  Temp(Src) 98.6 F (37 C) (Oral)  Resp 12  SpO2 98% Physical Exam  Nursing note and vitals reviewed. Constitutional: She is oriented to person, place, and time. She appears well-developed and well-nourished. No distress.  HENT:  Head: Normocephalic.  Eyes: Conjunctivae and EOM are normal.  Very mild area of erythema and slight edema to the right upper eyelid. No significant signs to indicate stye. No foreign bodies or bee stinger.  There is slight irregularity to the shape of right pupil. There are signs of her history of  previous surgery. Eye is normal in appearance otherwise without erythema. No pain to the globe. Normal extraocular movements.  Cardiovascular: Normal rate and regular rhythm.   Pulmonary/Chest: Effort normal and breath sounds normal. No respiratory distress. She has no wheezes. She has no rales.  Neurological: She is alert and oriented to person, place, and time.  Skin: Skin is warm and dry. No rash noted.  Psychiatric: She has a normal mood and affect. Her behavior is normal.    ED Course  Procedures     1. Bee sting, initial encounter     MDM  Patient seen and evaluated. Patient well-appearing in no acute distress.  Patient with slight erythema and very mild possible edema to the right upper eyelid. This may be consistent with her history of a recent bee sting or by. No significant swelling at this time.    Angus Seller, PA-C 11/02/12 [phone]

## 2012-11-15 ENCOUNTER — Encounter (INDEPENDENT_AMBULATORY_CARE_PROVIDER_SITE_OTHER): Payer: Medicare Other | Admitting: Surgery

## 2012-11-30 ENCOUNTER — Other Ambulatory Visit: Payer: Self-pay

## 2012-11-30 DIAGNOSIS — Z1231 Encounter for screening mammogram for malignant neoplasm of breast: Secondary | ICD-10-CM

## 2012-12-22 ENCOUNTER — Ambulatory Visit
Admission: RE | Admit: 2012-12-22 | Discharge: 2012-12-22 | Disposition: A | Discharge status: Home / Self Care | Payer: PRIVATE HEALTH INSURANCE | Source: Ambulatory Visit

## 2012-12-22 DIAGNOSIS — Z1231 Encounter for screening mammogram for malignant neoplasm of breast: Secondary | ICD-10-CM

## 2012-12-22 LAB — HM MAMMOGRAPHY

## 2013-08-16 ENCOUNTER — Ambulatory Visit: Payer: Medicare Other | Admitting: Internal Medicine

## 2013-10-12 ENCOUNTER — Telehealth: Payer: Self-pay | Admitting: Physician Assistant

## 2013-10-12 NOTE — Telephone Encounter (Signed)
Pt having problems with hemorrhoids and rectal pain. Requesting pt to be seen. Pt scheduled to see Nicoletta Ba PA 10/14/13@10 :Burt Ek to notify pt of appt date and time.

## 2013-10-14 ENCOUNTER — Ambulatory Visit: Payer: Medicare Other | Admitting: Physician Assistant

## 2013-12-12 ENCOUNTER — Other Ambulatory Visit: Payer: Self-pay

## 2013-12-12 DIAGNOSIS — Z1231 Encounter for screening mammogram for malignant neoplasm of breast: Secondary | ICD-10-CM

## 2013-12-24 ENCOUNTER — Encounter: Payer: Self-pay | Admitting: Gastroenterology

## 2013-12-26 ENCOUNTER — Ambulatory Visit: Payer: Medicare Other

## 2013-12-30 ENCOUNTER — Ambulatory Visit
Admission: RE | Admit: 2013-12-30 | Discharge: 2013-12-30 | Disposition: A | Discharge status: Home / Self Care | Payer: Medicare HMO | Source: Ambulatory Visit

## 2013-12-30 ENCOUNTER — Encounter (INDEPENDENT_AMBULATORY_CARE_PROVIDER_SITE_OTHER): Payer: Self-pay

## 2013-12-30 DIAGNOSIS — Z1231 Encounter for screening mammogram for malignant neoplasm of breast: Secondary | ICD-10-CM

## 2014-02-22 ENCOUNTER — Ambulatory Visit (INDEPENDENT_AMBULATORY_CARE_PROVIDER_SITE_OTHER): Payer: Medicare HMO | Admitting: Gastroenterology

## 2014-02-22 ENCOUNTER — Encounter: Payer: Self-pay | Admitting: Gastroenterology

## 2014-02-22 VITALS — BP 158/74 | HR 64 | Ht 62.0 in | Wt 121.1 lb

## 2014-02-22 DIAGNOSIS — K642 Third degree hemorrhoids: Secondary | ICD-10-CM

## 2014-02-22 NOTE — Patient Instructions (Signed)
Your 1st hemorrhoidal banding is scheduled on 04/10/2014 at 9:15am

## 2014-02-22 NOTE — Assessment & Plan Note (Signed)
Patient is symptomatic grade 3 hemorrhoids  Recommendations #1 band ligation

## 2014-02-22 NOTE — Progress Notes (Signed)
      History of Present Illness:  Ms. Kimberly Mclean. return for followup of hemorrhoids despite topical therapy with suppositories she remains symptomatic characterized by pain and protrusion of hemorrhoids.    Review of Systems: Pertinent positive and negative review of systems were noted in the above HPI section. All other review of systems were otherwise negative.    Current Medications, Allergies, Past Medical History, Past Surgical History, Family History and Social History were reviewed in [facility] record  Vital signs were reviewed in today's medical record. Physical Exam: General: Well developed , well nourished, no acute distress On rectal exam there are grade 3 hemorrhoids  See Assessment and Plan under Problem List

## 2014-04-10 ENCOUNTER — Ambulatory Visit (INDEPENDENT_AMBULATORY_CARE_PROVIDER_SITE_OTHER): Payer: Medicare HMO | Admitting: Gastroenterology

## 2014-04-10 ENCOUNTER — Encounter: Payer: Self-pay | Admitting: Gastroenterology

## 2014-04-10 VITALS — BP 134/80 | HR 68 | Ht 62.0 in | Wt 116.6 lb

## 2014-04-10 DIAGNOSIS — K642 Third degree hemorrhoids: Secondary | ICD-10-CM

## 2014-04-10 NOTE — Patient Instructions (Signed)
HEMORRHOID BANDING PROCEDURE    FOLLOW-UP CARE   1. The procedure you have had should have been relatively painless since the banding of the area involved does not have nerve endings and there is no pain sensation.  The rubber band cuts off the blood supply to the hemorrhoid and the band may fall off as soon as 48 hours after the banding (the band may occasionally be seen in the toilet bowl following a bowel movement). You may notice a temporary feeling of fullness in the rectum which should respond adequately to plain Tylenol or Motrin.  2. Following the banding, avoid strenuous exercise that evening and resume full activity the next day.  A sitz bath (soaking in a warm tub) or bidet is soothing, and can be useful for cleansing the area after bowel movements.     3. To avoid constipation, take two tablespoons of natural wheat bran, natural oat bran, flax, Benefiber or any over the counter fiber supplement and increase your water intake to 7-8 glasses daily.    4. Unless you have been prescribed anorectal medication, do not put anything inside your rectum for two weeks: No suppositories, enemas, fingers, etc.  5. Occasionally, you may have more bleeding than usual after the banding procedure.  This is often from the untreated hemorrhoids rather than the treated one.  Don't be concerned if there is a tablespoon or so of blood.  If there is more blood than this, lie flat with your bottom higher than your head and apply an ice pack to the area. If the bleeding does not stop within a half an hour or if you feel faint, call our office at [phone] or go to the emergency room.  6. Problems are not common; however, if there is a substantial amount of bleeding, severe pain, chills, fever or difficulty passing urine (very rare) or other problems, you should call us at [phone] or report to the nearest emergency room.  7. Do not stay seated continuously for more than 2-3 hours for a day or two  after the procedure.  Tighten your buttock muscles 10-15 times every two hours and take 10-15 deep breaths every 1-2 hours.  Do not spend more than a few minutes on the toilet if you cannot empty your bowel; instead re-visit the toilet at a later time.   Your 2nd banding is scheduled on 06/07/2014 at 8:30am

## 2014-04-10 NOTE — Progress Notes (Signed)
PROCEDURE NOTE: The patient presents with symptomatic grade *3**  hemorrhoids, requesting rubber band ligation of his/her hemorrhoidal disease.  All risks, benefits and alternative forms of therapy were described and informed consent was obtained.   The anorectum was pre-medicated with lubricant and nitroglycerine ointment The decision was made to band the *right posterior** internal hemorrhoid, and the [facility] [place] System was used to perform band ligation without complication.  Digital anorectal examination was then performed to assure proper positioning of the band, and to adjust the banded tissue as required.  The patient was discharged home without pain or other issues.  Dietary and behavioral recommendations were given and along with follow-up instructions.    The patient will return in *2** weeks for  follow-up and possible additional banding as required. No complications were encountered and the patient tolerated the procedure well.   

## 2014-05-30 ENCOUNTER — Telehealth: Payer: Self-pay | Admitting: Gastroenterology

## 2014-05-31 NOTE — Telephone Encounter (Signed)
Diane form [employer] is calling with some dates to see if we can reschedule.   2/17 2/19 2/25 2/26   Can we get it moved to one of these?  [phone]

## 2014-05-31 NOTE — Telephone Encounter (Signed)
We rescheduled patients appointment for banding on 2/19. Sheri spoke with patient and discussed her new appointment time patient verbally understands   Also returned [place] call from [employer] and left a message of new appt time and date

## 2014-06-07 ENCOUNTER — Encounter: Payer: Medicare HMO | Admitting: Gastroenterology

## 2014-06-23 ENCOUNTER — Ambulatory Visit (INDEPENDENT_AMBULATORY_CARE_PROVIDER_SITE_OTHER): Payer: Medicare HMO | Admitting: Gastroenterology

## 2014-06-23 ENCOUNTER — Encounter: Payer: Self-pay | Admitting: Gastroenterology

## 2014-06-23 VITALS — BP 148/90 | HR 60 | Resp 14 | Ht 61.0 in | Wt 114.8 lb

## 2014-06-23 DIAGNOSIS — K642 Third degree hemorrhoids: Secondary | ICD-10-CM

## 2014-06-23 MED ORDER — HYDROCORTISONE ACETATE 25 MG RE SUPP
25.0000 mg | Freq: Two times a day (BID) | RECTAL | Status: DC
Start: 2014-06-23 — End: 2014-11-13

## 2014-06-23 MED ORDER — HYDROCORTISONE ACETATE 25 MG RE SUPP: 25.0000 mg | Freq: Two times a day (BID) | RECTAL | Status: DC

## 2014-06-23 MED ORDER — LINACLOTIDE 145 MCG PO CAPS: 145.0000 ug | ORAL_CAPSULE | Freq: Every day | ORAL | Status: DC

## 2014-06-23 NOTE — Patient Instructions (Signed)
We are sending in [place] to your pharmacy and also Rectal Suppositories  The Linzess is to help move your bowels for the constipation and the rectal suppositories are for your rectal discomfort Please schedule a follow up appointment in 4-6 weeks

## 2014-06-23 NOTE — Addendum Note (Signed)
Addended by: Oda Kilts on: 06/23/2014 10:50 AM   Modules accepted: Orders

## 2014-06-23 NOTE — Progress Notes (Signed)
      History of Present Illness:  Ms. Kimberly Mclean is returned for follow-up of hemorrhoids.  In December she underwent band ligation of her right posterior hemorrhoidal bundle.  Since then she's been suffering from constipation.  She takes a stool softener but strains at moving her bowels.  She no longer has protrusion of hemorrhoids but she's complaining of rectal pain that radiates into her buttocks.    Review of Systems: Pertinent positive and negative review of systems were noted in the above HPI section. All other review of systems were otherwise negative.    Current Medications, Allergies, Past Medical History, Past Surgical History, Family History and Social History were reviewed in [facility] record  Vital signs were reviewed in today's medical record. Physical Exam: General: Well developed , well nourished, no acute distress External exam of the rectum and digital exam does not reveal any abnormalities  See Assessment and Plan under Problem List

## 2014-06-23 NOTE — Assessment & Plan Note (Signed)
At this time she's having both hemorrhoidal pain and constipation.  The latter is clearly affecting the former.  I explained to her that we need to treat her constipation before embarking any further on hemorrhoidal therapy.  Recommendations #1 daily fiber supplementation #2 trial of Linzess #3 Anusol HC suppositories

## 2014-06-29 ENCOUNTER — Telehealth: Payer: Self-pay | Admitting: Gastroenterology

## 2014-09-11 ENCOUNTER — Encounter: Payer: Self-pay | Admitting: Gastroenterology

## 2014-09-11 ENCOUNTER — Ambulatory Visit (INDEPENDENT_AMBULATORY_CARE_PROVIDER_SITE_OTHER): Payer: Medicare HMO | Admitting: Gastroenterology

## 2014-09-11 VITALS — BP 142/80 | HR 56 | Ht 61.0 in | Wt 116.0 lb

## 2014-09-11 DIAGNOSIS — K642 Third degree hemorrhoids: Secondary | ICD-10-CM

## 2014-09-11 NOTE — Patient Instructions (Addendum)
HEMORRHOID BANDING PROCEDURE    FOLLOW-UP CARE   1. The procedure you have had should have been relatively painless since the banding of the area involved does not have nerve endings and there is no pain sensation.  The rubber band cuts off the blood supply to the hemorrhoid and the band may fall off as soon as 48 hours after the banding (the band may occasionally be seen in the toilet bowl following a bowel movement). You may notice a temporary feeling of fullness in the rectum which should respond adequately to plain Tylenol or Motrin.  2. Following the banding, avoid strenuous exercise that evening and resume full activity the next day.  A sitz bath (soaking in a warm tub) or bidet is soothing, and can be useful for cleansing the area after bowel movements.     3. To avoid constipation, take two tablespoons of natural wheat bran, natural oat bran, flax, Benefiber or any over the counter fiber supplement and increase your water intake to 7-8 glasses daily.    4. Unless you have been prescribed anorectal medication, do not put anything inside your rectum for two weeks: No suppositories, enemas, fingers, etc.  5. Occasionally, you may have more bleeding than usual after the banding procedure.  This is often from the untreated hemorrhoids rather than the treated one.  Don't be concerned if there is a tablespoon or so of blood.  If there is more blood than this, lie flat with your bottom higher than your head and apply an ice pack to the area. If the bleeding does not stop within a half an hour or if you feel faint, call our office at [phone] or go to the emergency room.  6. Problems are not common; however, if there is a substantial amount of bleeding, severe pain, chills, fever or difficulty passing urine (very rare) or other problems, you should call us at [phone] or report to the nearest emergency room.  7. Do not stay seated continuously for more than 2-3 hours for a day or two  after the procedure.  Tighten your buttock muscles 10-15 times every two hours and take 10-15 deep breaths every 1-2 hours.  Do not spend more than a few minutes on the toilet if you cannot empty your bowel; instead re-visit the toilet at a later time.   Your follow up appointment is scheduled on 11/13/2014 at 10:15am   Your Blood pressure was elevated today Please follow up with your PCP

## 2014-09-11 NOTE — Progress Notes (Signed)
Since her last visit the patient reports resolution of constipation.  She still has protrusion of hemorrhoids.  She could not afford Linzess but claims that she no longer requires it.  PROCEDURE NOTE: The patient presents with symptomatic grade *3**  hemorrhoids, requesting rubber band ligation of his/her hemorrhoidal disease.  All risks, benefits and alternative forms of therapy were described and informed consent was obtained.   The anorectum was pre-medicated with lubricant and nitroglycerine ointment The decision was made to band the *right anterior** internal hemorrhoid, and the [place] was used to perform band ligation without complication.  Digital anorectal examination was then performed to assure proper positioning of the band, and to adjust the banded tissue as required.  The patient was discharged home without pain or other issues.  Dietary and behavioral recommendations were given and along with follow-up instructions.    The patient will return in *4** weeks for  follow-up and possible additional banding as required. No complications were encountered and the patient tolerated the procedure well.

## 2014-11-13 ENCOUNTER — Ambulatory Visit: Payer: Commercial Managed Care - HMO | Admitting: Gastroenterology

## 2014-11-13 ENCOUNTER — Encounter: Payer: Self-pay | Admitting: Gastroenterology

## 2014-11-17 LAB — LIPID PANEL
Cholesterol: 222 — AB (ref 0–200)
HDL: 93 — AB (ref 35–70)
LDL Cholesterol: 111
Triglycerides: 88 (ref 40–160)

## 2014-11-17 LAB — HEMOGLOBIN A1C: Hemoglobin A1C: 5.3

## 2014-11-17 LAB — HEPATIC FUNCTION PANEL
ALT: 23 (ref 7–35)
AST: 27 (ref 13–35)
Alkaline Phosphatase: 67 (ref 25–125)
BILIRUBIN, TOTAL: 0.8

## 2014-11-17 LAB — CBC AND DIFFERENTIAL
HEMATOCRIT: 41 (ref 36–46)
HEMOGLOBIN: 13 (ref 12.0–16.0)
PLATELETS: 279 (ref 150–399)
WBC: 4.1

## 2014-11-17 LAB — BASIC METABOLIC PANEL
BUN: 19 (ref 4–21)
Creatinine: 0.9 (ref 0.5–1.1)
GLUCOSE: 57
Potassium: 4.3 (ref 3.4–5.3)
SODIUM: 145 (ref 137–147)

## 2014-11-17 LAB — TSH: TSH: 0.67 (ref 0.41–5.90)

## 2014-12-05 ENCOUNTER — Telehealth: Payer: Self-pay | Admitting: Gastroenterology

## 2014-12-05 NOTE — Telephone Encounter (Signed)
Patient calling to see if a referral has been done for her to come into the office to have her last banding.  She was seeing a new PCP that needed to refer her. Jes Can you help me with this?

## 2014-12-28 ENCOUNTER — Ambulatory Visit: Payer: Commercial Managed Care - HMO | Admitting: Physician Assistant

## 2015-01-04 ENCOUNTER — Telehealth: Payer: Self-pay | Admitting: *Deleted

## 2015-01-04 NOTE — Telephone Encounter (Signed)
[facility] and spoke to [place].  I asked if she knows if there was any other reasons this patient is coming to see Arta Bruce PA tomorrow 01-05-2015? I explained that we are trying also to schedule her for her 3rd banding for hemorrhoids.  We called her Monday 4 times and we got no answer on [phone]. Judeen Hammans said she doesn't know anything about bandings but she needs the appointment with [place] for abdominal pain.  Sherry faxed me the last office note at [facility] from 11-17-2014.There is nothing in that office note that mentions abdominal pain. I tried also to call the patient today at [phone] and got no answer.  I also called one of the patient's contacts, Dot Colson at [phone]. Dot said she would try to reach the patient and call me back.

## 2015-01-04 NOTE — Telephone Encounter (Signed)
The patient called me and told me to cancel her appointment for tomorrow 01-05-2015. She said she only needs a banding appointment for her hemorrhoids.  She said she should have had her third banding by now. I made her an appointment with Dr. Deatra Ina for 02-28-2015 at 10:45 am.  She wasn't happy that she has to wait so long.  I apologized.  She said she will have to "line up" her transportation.

## 2015-01-05 ENCOUNTER — Ambulatory Visit: Payer: Commercial Managed Care - HMO | Admitting: Physician Assistant

## 2015-02-05 NOTE — Telephone Encounter (Signed)
[employer] auth# [id number] GOOD 12-12-14 TO 06-10-15/YF

## 2015-02-19 NOTE — Telephone Encounter (Signed)
Appointment scheduled for pt.

## 2015-02-23 ENCOUNTER — Other Ambulatory Visit: Payer: Self-pay

## 2015-02-23 DIAGNOSIS — Z1231 Encounter for screening mammogram for malignant neoplasm of breast: Secondary | ICD-10-CM

## 2015-02-28 ENCOUNTER — Encounter: Payer: Commercial Managed Care - HMO | Admitting: Gastroenterology

## 2015-03-22 ENCOUNTER — Ambulatory Visit
Admission: RE | Admit: 2015-03-22 | Discharge: 2015-03-22 | Disposition: A | Discharge status: Home / Self Care | Payer: Medicare HMO | Source: Ambulatory Visit

## 2015-03-22 DIAGNOSIS — Z1231 Encounter for screening mammogram for malignant neoplasm of breast: Secondary | ICD-10-CM

## 2015-12-26 ENCOUNTER — Ambulatory Visit: Payer: Self-pay | Admitting: Internal Medicine

## 2016-03-10 ENCOUNTER — Emergency Department (HOSPITAL_COMMUNITY)
Admission: EM | Admit: 2016-03-10 | Discharge: 2016-03-10 | Disposition: A | Discharge status: Home / Self Care | Payer: Medicare HMO | Attending: Emergency Medicine | Admitting: Emergency Medicine

## 2016-03-10 ENCOUNTER — Encounter (HOSPITAL_COMMUNITY): Payer: Self-pay | Admitting: Emergency Medicine

## 2016-03-10 DIAGNOSIS — H59311 Postprocedural hemorrhage and hematoma of right eye and adnexa following an ophthalmic procedure: Secondary | ICD-10-CM

## 2016-03-10 DIAGNOSIS — I10 Essential (primary) hypertension: Secondary | ICD-10-CM | POA: Insufficient documentation

## 2016-03-10 MED ORDER — SODIUM CHLORIDE 0.9 % IV SOLN: 30.0000 meq | Freq: Once | INTRAVENOUS | Status: DC

## 2016-03-10 NOTE — ED Notes (Signed)
Harris PA changed dressing with reassessment of eye verbalizes only small amount of bleeding present; rewrapped and continue to monitor. Pt resting quietly without distress.

## 2016-03-10 NOTE — Discharge Instructions (Signed)
Keep Your eye patch on until you see Dr. Gershon Crane tomorrow.  If you have pain in the eye. Please loosen the bandage. If you begin bleeding again apply firm pressure to the eye for 20 minutes and then gently remove pressure. If your bleeding does not stop, call 911 and come back to the emergency department. Also, avoid in any activity that may cause her to strain heavily.

## 2016-03-10 NOTE — ED Notes (Signed)
Sign pad not available. Understanding about discharge instructions was verbalized.

## 2016-03-10 NOTE — ED Provider Notes (Signed)
[place] DEPT Provider Note   CSN: [id number] Arrival date & time: 03/10/16  1531     History   Chief Complaint Chief Complaint  Patient presents with  . Hemorrhage of RIght Eye    HPI LOWYN VESCOVI is a 75 y.o. female who presents with bleeding from the had a chalazion neck to me, performed this morning by Dr. Rutherford Guys. The patient states that she began bleeding profusely through her dressing prior to arrival. She does not drive and took an ambulance to the emergency department. She complains of pain in the right eye and significant bleeding. She denies a history of blood thinner use. I spoke with the office staff at Dr. Harriett Rush office. They state that she was bleeding fairly briskly, however, they generally  HPI expect these to stop with direct pressure  Past Medical History:  Diagnosis Date  . Glaucoma    right eye  . Hyperlipidemia   . Hypertension   . Osteopenia   . Osteoporosis     Patient Active Problem List   Diagnosis Date Noted  . Diverticulosis 01/16/2012  . Hemorrhoids 01/16/2012  . HTN (hypertension) 01/16/2012    Past Surgical History:  Procedure Laterality Date  . BREAST SURGERY  1970's   removed 2 benign lumps  . LUNG SURGERY     left lung punctured-surgically repaired  . TOTAL ABDOMINAL HYSTERECTOMY      OB History    No data available       Home Medications    Prior to Admission medications   Medication Sig Start Date End Date Taking? Authorizing Provider  amLODipine (NORVASC) 2.5 MG tablet Take 2.5 mg by mouth daily.   Yes Historical Provider, MD  brimonidine (ALPHAGAN P) 0.1 % SOLN Place 1 drop into the right eye daily.    Yes Historical Provider, MD  dorzolamide-timolol (COSOPT) 22.3-6.8 MG/ML ophthalmic solution INSTILL 1 DROP INTO BOTH EYES TWICE DAILY 12/13/15  Yes Historical Provider, MD  gabapentin (NEURONTIN) 600 MG tablet  11/08/15  Yes Historical Provider, MD  latanoprost (XALATAN) 0.005 % ophthalmic solution INSTILL 1  DROP INTO BOTH EYES AT BEDTIME 03/10/16  Yes Historical Provider, MD  predniSONE (DELTASONE) 5 MG tablet  12/14/15  Yes Historical Provider, MD  alendronate (FOSAMAX) 70 MG tablet Take 70 mg by mouth once a week.     Historical Provider, MD  amLODipine (NORVASC) 2.5 MG tablet Take by mouth.    Historical Provider, MD  CALCIUM & MAGNESIUM CARBONATES PO Take 1 tablet by mouth daily.     Historical Provider, MD  dorzolamide (TRUSOPT) 2 % ophthalmic solution Place 1 drop into the right eye 3 (three) times daily.     Historical Provider, MD  gabapentin (NEURONTIN) 300 MG capsule Take 300 mg by mouth daily. Pt takes at bedtime    Historical Provider, MD  losartan (COZAAR) 100 MG tablet Take 100 mg by mouth daily.    Historical Provider, MD  oxybutynin (DITROPAN-XL) 5 MG 24 hr tablet Take 5 mg by mouth at bedtime. Pt takes 1/2 tab by mouth twice daily    Historical Provider, MD  oxybutynin (DITROPAN-XL) 5 MG 24 hr tablet Take by mouth.    Historical Provider, MD  Telmisartan (MICARDIS PO) Take by mouth.    Historical Provider, MD    Family History Family History  Problem Relation Age of Onset  . Prostate cancer Father   . Hypertension Mother     brother also  . Lung cancer Sister   .  Emphysema Sister   . Colon cancer Neg Hx     Social History Social History  Substance Use Topics  . Smoking status: Never Smoker  . Smokeless tobacco: Never Used  . Alcohol use No     Allergies   Patient has no known allergies.   Review of Systems Review of Systems  Ten systems reviewed and are negative for acute change, except as noted in the HPI.   Physical Exam Updated Vital Signs BP (!) 155/106 (BP Location: Left Arm)   Pulse 115   Temp 98.9 F (37.2 C) (Oral)   Resp 16   SpO2 99%   Physical Exam  Constitutional: She is oriented to person, place, and time. She appears well-developed and well-nourished. No distress.  HENT:  Head: Normocephalic and atraumatic.  Eyes: Conjunctivae are  normal. No scleral icterus.  R eye bleeding briskly. Evaluation limited due to bleeding  Neck: Normal range of motion.  Cardiovascular: Normal rate, regular rhythm and normal heart sounds.  Exam reveals no gallop and no friction rub.   No murmur heard. Pulmonary/Chest: Effort normal and breath sounds normal. No respiratory distress.  Abdominal: Soft. Bowel sounds are normal. She exhibits no distension and no mass. There is no tenderness. There is no guarding.  Neurological: She is alert and oriented to person, place, and time.  Skin: Skin is warm and dry. She is not diaphoretic.     ED Treatments / Results  Labs (all labs ordered are listed, but only abnormal results are displayed) Labs Reviewed - No data to display  EKG  EKG Interpretation None       Radiology No results found.  Procedures Procedures (including critical care time)  Medications Ordered in ED Medications - No data to display   Initial Impression / Assessment and Plan / ED Course  I have reviewed the triage vital signs and the nursing notes.  Pertinent labs & imaging results that were available during my care of the patient were reviewed by me and considered in my medical decision making (see chart for details).  Clinical Course as of Mar 10 1930  [facility] Mar 10, 2016  1651 I spoke with Dr. Gershon Crane who reccommends direct pressure and Ice. Will reevaluate shortly  [AH]  1754 Pressure dressing removed and bleeding has mostly resolved. She had a slight trickle of blood. I have replaced the dressing after washing her face. She is more relaxed and her VS have normalized. I will observe the patient for another 30-40 minutes to ensure that her eye does not bleed again. Patient is very anxious about going home.  [AH]    Clinical Course User Index [AH] Margarita Mail, PA-C    .Patient observed in the ED without any return in bleeding. I have given the patient discharge instructions to include avoiding straining  activity, applying pressure for 20 minutes, should bleeding occur, and didn't return to the emergency department for unresolved bleeding. She is to follow up with Dr. Gershon Crane in his office tomorrow. She appears safe for discharge at this time  Final Clinical Impressions(s) / ED Diagnoses   Final diagnoses:  Postoperative hemorrhage of right eye following ophthalmic procedure    New Prescriptions New Prescriptions   No medications on file     Margarita Mail, PA-C 03/10/16 1932    Sherwood Gambler, MD 03/12/16 1720

## 2016-03-10 NOTE — ED Triage Notes (Addendum)
Per EMS pt from home with complaint of bleeding to right eye post chalazion of right upper eyelid today. Pt was instructed to take patch over eye off 4 hours post op; pt continued to pull patch off when adhered to eyelid; clots/bleeding present.

## 2016-03-10 NOTE — ED Notes (Signed)
Providers in lounge, Little MD and Kirichenko PA, notified of pt complaint and status.

## 2016-03-10 NOTE — ED Notes (Signed)
Pressure applied with multiple 4 by 4 guaze directly over right eyelid wrapped with guaze/coband with assistance of [employer].

## 2016-03-10 NOTE — ED Notes (Signed)
Bed: [facility] Expected date:  Expected time:  Means of arrival:  Comments: EMS-eye surgery-stitch removed

## 2016-03-10 NOTE — Progress Notes (Signed)
CSW provided patient with taxi voucher.   Kingsley Spittle, Marty Clinical Social Worker [phone]

## 2016-05-26 ENCOUNTER — Telehealth: Payer: Self-pay | Admitting: Nurse Practitioner

## 2016-06-30 ENCOUNTER — Telehealth: Payer: Self-pay

## 2016-06-30 ENCOUNTER — Ambulatory Visit (INDEPENDENT_AMBULATORY_CARE_PROVIDER_SITE_OTHER): Payer: Medicare HMO | Admitting: Nurse Practitioner

## 2016-06-30 ENCOUNTER — Encounter: Payer: Self-pay | Admitting: Nurse Practitioner

## 2016-06-30 VITALS — BP 143/78 | HR 64 | Temp 97.5°F | Resp 17 | Ht 61.0 in | Wt 119.8 lb

## 2016-06-30 DIAGNOSIS — Z1231 Encounter for screening mammogram for malignant neoplasm of breast: Secondary | ICD-10-CM

## 2016-06-30 DIAGNOSIS — R413 Other amnesia: Secondary | ICD-10-CM

## 2016-06-30 DIAGNOSIS — E785 Hyperlipidemia, unspecified: Secondary | ICD-10-CM | POA: Diagnosis not present

## 2016-06-30 DIAGNOSIS — G629 Polyneuropathy, unspecified: Secondary | ICD-10-CM

## 2016-06-30 DIAGNOSIS — M8580 Other specified disorders of bone density and structure, unspecified site: Secondary | ICD-10-CM | POA: Diagnosis not present

## 2016-06-30 DIAGNOSIS — H401193 Primary open-angle glaucoma, unspecified eye, severe stage: Secondary | ICD-10-CM

## 2016-06-30 DIAGNOSIS — N3281 Overactive bladder: Secondary | ICD-10-CM

## 2016-06-30 DIAGNOSIS — I10 Essential (primary) hypertension: Secondary | ICD-10-CM | POA: Diagnosis not present

## 2016-06-30 DIAGNOSIS — M1990 Unspecified osteoarthritis, unspecified site: Secondary | ICD-10-CM

## 2016-06-30 MED ORDER — SOLIFENACIN SUCCINATE 5 MG PO TABS
5.0000 mg | ORAL_TABLET | Freq: Every day | ORAL | 1 refills | Status: DC
Start: 2016-06-30 — End: 2016-09-01

## 2016-06-30 NOTE — Patient Instructions (Addendum)
Tylenol 325 mg 2 tablets every 6 hours as needed for pain in your hands.   Will send prescription for vesicare to pharmacy for your overactive bladder  Please sign record release from her previous PCP  Please have your son come to your next appt.

## 2016-06-30 NOTE — Telephone Encounter (Signed)
Patient's son, [name], called back and stated that he did not think he would be able to come to patient's next appointment due to his work schedule. He did state that if the provider had any concerns, then she was welcome to call him.

## 2016-06-30 NOTE — Telephone Encounter (Signed)
I called patient's son, [name], to see if he could come with patient to her next appointment on 07/28/16 at 1:30 pm.    I left a message asking [name] to return call to the office.
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
## 2016-07-08 ENCOUNTER — Telehealth: Payer: Self-pay | Admitting: *Deleted

## 2016-07-08 NOTE — Telephone Encounter (Signed)
Patient called and stated that her insurance will not pay for Vesicare it will cost her $190. She called her insurance and they will cover Oxybutynin 5mg  and she wants it changed to that. Wants a Rx called to [facility]. Please Advise.

## 2016-07-09 NOTE — Telephone Encounter (Signed)
Patient notified and agreed.  Confirmed appointment with patient.  

## 2016-07-09 NOTE — Telephone Encounter (Signed)
Will not prescribe at this time due to side effects of oxybutynin we will discuss at her next OV

## 2016-07-22 ENCOUNTER — Telehealth: Payer: Self-pay

## 2016-07-22 NOTE — Telephone Encounter (Signed)
I called patient to remind her to bring all of her medication bottles with her to next OV  appt on 09/01/16.  Left a message on voicemail for patient to call the office.

## 2016-07-24 ENCOUNTER — Other Ambulatory Visit: Payer: Medicare HMO

## 2016-07-24 DIAGNOSIS — I10 Essential (primary) hypertension: Secondary | ICD-10-CM

## 2016-07-24 DIAGNOSIS — E785 Hyperlipidemia, unspecified: Secondary | ICD-10-CM

## 2016-07-24 LAB — CBC WITH DIFFERENTIAL/PLATELET
BASOS PCT: 1 %
Basophils Absolute: 46 cells/uL (ref 0–200)
Eosinophils Absolute: 0 cells/uL — ABNORMAL LOW (ref 15–500)
Eosinophils Relative: 0 %
HCT: 41.2 % (ref 35.0–45.0)
Hemoglobin: 13.1 g/dL (ref 11.7–15.5)
LYMPHS PCT: 34 %
Lymphs Abs: 1564 cells/uL (ref 850–3900)
MCH: 28.1 pg (ref 27.0–33.0)
MCHC: 31.8 g/dL — ABNORMAL LOW (ref 32.0–36.0)
MCV: 88.2 fL (ref 80.0–100.0)
MONO ABS: 552 {cells}/uL (ref 200–950)
MONOS PCT: 12 %
MPV: 10 fL (ref 7.5–12.5)
NEUTROS ABS: 2438 {cells}/uL (ref 1500–7800)
Neutrophils Relative %: 53 %
Platelets: 279 10*3/uL (ref 140–400)
RBC: 4.67 MIL/uL (ref 3.80–5.10)
RDW: 13.9 % (ref 11.0–15.0)
WBC: 4.6 10*3/uL (ref 3.8–10.8)

## 2016-07-24 LAB — COMPLETE METABOLIC PANEL WITH GFR
ALT: 14 U/L (ref 6–29)
AST: 17 U/L (ref 10–35)
Albumin: 4.2 g/dL (ref 3.6–5.1)
Alkaline Phosphatase: 47 U/L (ref 33–130)
BUN: 15 mg/dL (ref 7–25)
CALCIUM: 9.2 mg/dL (ref 8.6–10.4)
CO2: 26 mmol/L (ref 20–31)
CREATININE: 0.85 mg/dL (ref 0.60–0.93)
Chloride: 107 mmol/L (ref 98–110)
GFR, EST AFRICAN AMERICAN: 77 mL/min (ref >=60)
GFR, EST NON AFRICAN AMERICAN: 67 mL/min (ref >=60)
Glucose, Bld: 81 mg/dL (ref 65–99)
Potassium: 4.2 mmol/L (ref 3.5–5.3)
Sodium: 141 mmol/L (ref 135–146)
Total Bilirubin: 0.7 mg/dL (ref 0.2–1.2)
Total Protein: 6.6 g/dL (ref 6.1–8.1)

## 2016-07-24 LAB — LIPID PANEL
CHOL/HDL RATIO: 2.7 ratio (ref <5.0)
CHOLESTEROL: 255 mg/dL — AB (ref <200)
HDL: 94 mg/dL (ref >50)
LDL Cholesterol: 144 mg/dL — ABNORMAL HIGH (ref <100)
TRIGLYCERIDES: 86 mg/dL (ref <150)
VLDL: 17 mg/dL (ref <30)

## 2016-07-28 ENCOUNTER — Ambulatory Visit: Payer: Medicare HMO | Admitting: Nurse Practitioner

## 2016-08-05 ENCOUNTER — Ambulatory Visit (INDEPENDENT_AMBULATORY_CARE_PROVIDER_SITE_OTHER): Payer: Medicare HMO

## 2016-08-05 VITALS — BP 150/82 | HR 69 | Temp 98.2°F | Ht 61.0 in | Wt 118.0 lb

## 2016-08-05 DIAGNOSIS — Z598 Other problems related to housing and economic circumstances: Secondary | ICD-10-CM | POA: Diagnosis not present

## 2016-08-05 DIAGNOSIS — Z0000 Encounter for general adult medical examination without abnormal findings: Secondary | ICD-10-CM | POA: Diagnosis not present

## 2016-08-05 DIAGNOSIS — Z599 Problem related to housing and economic circumstances, unspecified: Secondary | ICD-10-CM

## 2016-08-05 NOTE — Patient Instructions (Addendum)
Kimberly Mclean , Thank you for taking time to come for your Medicare Wellness Visit. I appreciate your ongoing commitment to your health goals. Please review the following plan we discussed and let me know if I can assist you in the future.   Screening recommendations/referrals: Colonoscopy up to date Mammogram up to date Bone Density up to date Recommended yearly ophthalmology/optometry visit for glaucoma screening and checkup Recommended yearly dental visit for hygiene and checkup  Vaccinations: Influenza vaccine up to date Pneumococcal vaccine due but declined. Let us know if you change your mind. Tdap vaccine due 2024 Shingles vaccine  Advanced directives: Advance directive discussed with you today. You have copy at home but declined filling it out.  Conditions/risks identified: Putting in referral for you to become a part of [employer]  Next appointment: Kimberly Mclean 4/30@ 10:45 am   Preventive Care 65 Years and Older, Female Preventive care refers to lifestyle choices and visits with your health care provider that can promote health and wellness. What does preventive care include?  A yearly physical exam. This is also called an annual well check.  Dental exams once or twice a year.  Routine eye exams. Ask your health care provider how often you should have your eyes checked.  Personal lifestyle choices, including:  Daily care of your teeth and gums.  Regular physical activity.  Eating a healthy diet.  Avoiding tobacco and drug use.  Limiting alcohol use.  Practicing safe sex.  Taking low-dose aspirin every day.  Taking vitamin and mineral supplements as recommended by your health care provider. What happens during an annual well check? The services and screenings done by your health care provider during your annual well check will depend on your age, overall health, lifestyle risk factors, and family history of disease. Counseling  Your health care  provider may ask you questions about your:  Alcohol use.  Tobacco use.  Drug use.  Emotional well-being.  Home and relationship well-being.  Sexual activity.  Eating habits.  History of falls.  Memory and ability to understand (cognition).  Work and work Statistician.  Reproductive health. Screening  You may have the following tests or measurements:  Height, weight, and BMI.  Blood pressure.  Lipid and cholesterol levels. These may be checked every 5 years, or more frequently if you are over 11 years old.  Skin check.  Lung cancer screening. You may have this screening every year starting at age 61 if you have a 30-pack-year history of smoking and currently smoke or have quit within the past 15 years.  Fecal occult blood test (FOBT) of the stool. You may have this test every year starting at age 67.  Flexible sigmoidoscopy or colonoscopy. You may have a sigmoidoscopy every 5 years or a colonoscopy every 10 years starting at age 15.  Hepatitis C blood test.  Hepatitis B blood test.  Sexually transmitted disease (STD) testing.  Diabetes screening. This is done by checking your blood sugar (glucose) after you have not eaten for a while (fasting). You may have this done every 1-3 years.  Bone density scan. This is done to screen for osteoporosis. You may have this done starting at age 9.  Mammogram. This may be done every 1-2 years. Talk to your health care provider about how often you should have regular mammograms. Talk with your health care provider about your test results, treatment options, and if necessary, the need for more tests. Vaccines  Your health care provider may recommend certain  vaccines, such as:  Influenza vaccine. This is recommended every year.  Tetanus, diphtheria, and acellular pertussis (Tdap, Td) vaccine. You may need a Td booster every 10 years.  Zoster vaccine. You may need this after age 32.  Pneumococcal 13-valent conjugate (PCV13)  vaccine. One dose is recommended after age 54.  Pneumococcal polysaccharide (PPSV23) vaccine. One dose is recommended after age 17. Talk to your health care provider about which screenings and vaccines you need and how often you need them. This information is not intended to replace advice given to you by your health care provider. Make sure you discuss any questions you have with your health care provider. Document Released: 05/18/2015 Document Revised: 01/09/2016 Document Reviewed: 02/20/2015 Elsevier Interactive Patient Education  2017 [place] Prevention in the Home Falls can cause injuries. They can happen to people of all ages. There are many things you can do to make your home safe and to help prevent falls. What can I do on the outside of my home?  Regularly fix the edges of walkways and driveways and fix any cracks.  Remove anything that might make you trip as you walk through a door, such as a raised step or threshold.  Trim any bushes or trees on the path to your home.  Use bright outdoor lighting.  Clear any walking paths of anything that might make someone trip, such as rocks or tools.  Regularly check to see if handrails are loose or broken. Make sure that both sides of any steps have handrails.  Any raised decks and porches should have guardrails on the edges.  Have any leaves, snow, or ice cleared regularly.  Use sand or salt on walking paths during winter.  Clean up any spills in your garage right away. This includes oil or grease spills. What can I do in the bathroom?  Use night lights.  Install grab bars by the toilet and in the tub and shower. Do not use towel bars as grab bars.  Use non-skid mats or decals in the tub or shower.  If you need to sit down in the shower, use a plastic, non-slip stool.  Keep the floor dry. Clean up any water that spills on the floor as soon as it happens.  Remove soap buildup in the tub or shower  regularly.  Attach bath mats securely with double-sided non-slip rug tape.  Do not have throw rugs and other things on the floor that can make you trip. What can I do in the bedroom?  Use night lights.  Make sure that you have a light by your bed that is easy to reach.  Do not use any sheets or blankets that are too big for your bed. They should not hang down onto the floor.  Have a firm chair that has side arms. You can use this for support while you get dressed.  Do not have throw rugs and other things on the floor that can make you trip. What can I do in the kitchen?  Clean up any spills right away.  Avoid walking on wet floors.  Keep items that you use a lot in easy-to-reach places.  If you need to reach something above you, use a strong step stool that has a grab bar.  Keep electrical cords out of the way.  Do not use floor polish or wax that makes floors slippery. If you must use wax, use non-skid floor wax.  Do not have throw rugs and other things  on the floor that can make you trip. What can I do with my stairs?  Do not leave any items on the stairs.  Make sure that there are handrails on both sides of the stairs and use them. Fix handrails that are broken or loose. Make sure that handrails are as long as the stairways.  Check any carpeting to make sure that it is firmly attached to the stairs. Fix any carpet that is loose or worn.  Avoid having throw rugs at the top or bottom of the stairs. If you do have throw rugs, attach them to the floor with carpet tape.  Make sure that you have a light switch at the top of the stairs and the bottom of the stairs. If you do not have them, ask someone to add them for you. What else can I do to help prevent falls?  Wear shoes that:  Do not have high heels.  Have rubber bottoms.  Are comfortable and fit you well.  Are closed at the toe. Do not wear sandals.  If you use a stepladder:  Make sure that it is fully  opened. Do not climb a closed stepladder.  Make sure that both sides of the stepladder are locked into place.  Ask someone to hold it for you, if possible.  Clearly mark and make sure that you can see:  Any grab bars or handrails.  First and last steps.  Where the edge of each step is.  Use tools that help you move around (mobility aids) if they are needed. These include:  Canes.  Walkers.  Scooters.  Crutches.  Turn on the lights when you go into a dark area. Replace any light bulbs as soon as they burn out.  Set up your furniture so you have a clear path. Avoid moving your furniture around.  If any of your floors are uneven, fix them.  If there are any pets around you, be aware of where they are.  Review your medicines with your doctor. Some medicines can make you feel dizzy. This can increase your chance of falling. Ask your doctor what other things that you can do to help prevent falls. This information is not intended to replace advice given to you by your health care provider. Make sure you discuss any questions you have with your health care provider. Document Released: 02/15/2009 Document Revised: 09/27/2015 Document Reviewed: 05/26/2014 Elsevier Interactive Patient Education  2017 [employer].

## 2016-08-05 NOTE — Progress Notes (Signed)
Quick Notes   Health Maintenance: Pneumonia vaccines due. Pt declines.     Abnormal Screen: MMSE taken 1 month ago.     Patient Concerns: Hands are always red and hurting. Pt problems with urine and bladder. NP notified     Nurse Concerns: Problems paying for urine medications.

## 2016-08-05 NOTE — Progress Notes (Signed)
Subjective:   Kimberly Mclean is a 76 y.o. female who presents for an Initial Medicare Annual Wellness Visit.     Objective:    Today's Vitals   08/05/16 1301  BP: (!) 150/82  Pulse: 69  Temp: 98.2 F (36.8 C)  TempSrc: Oral  SpO2: 97%  Weight: 118 lb (53.5 kg)  Height: 5\' 1"  (1.549 m)   Body mass index is 22.3 kg/m.   Current Medications (verified) Outpatient Encounter Prescriptions as of 08/05/2016  Medication Sig  . gabapentin (NEURONTIN) 600 MG tablet Take 600 mg by mouth at bedtime.   Marland Kitchen losartan (COZAAR) 100 MG tablet Take 100 mg by mouth daily.  . predniSONE (DELTASONE) 5 MG tablet Take 5 mg by mouth daily.   Marland Kitchen amLODipine (NORVASC) 2.5 MG tablet Take 2.5 mg by mouth daily.  . cholecalciferol (VITAMIN D) 1000 units tablet Take 1,000 Units by mouth daily.  . dorzolamide-timolol (COSOPT) 22.3-6.8 MG/ML ophthalmic solution INSTILL 1 DROP INTO BOTH EYES TWICE DAILY  . latanoprost (XALATAN) 0.005 % ophthalmic solution INSTILL 1 DROP INTO BOTH EYES AT BEDTIME  . solifenacin (VESICARE) 5 MG tablet Take 1 tablet (5 mg total) by mouth daily. (Patient not taking: Reported on 08/05/2016)   No facility-administered encounter medications on file as of 08/05/2016.     Allergies (verified) Patient has no known allergies.   History: Past Medical History:  Diagnosis Date  . Chronic renal disease, stage II   . Constipation   . Dementia   . Fibromyositis   . Glaucoma    right eye  . Hemorrhoids   . History of pneumothorax    left  . Hyperlipidemia   . Hypertension   . Hypertensive renal disease, benign   . Multiple rib fractures    Left side: eibs 3 through 6  . Osteopenia   . Osteoporosis   . Vitamin D deficiency    Past Surgical History:  Procedure Laterality Date  . BREAST SURGERY  1970's   removed 2 benign lumps  . LUNG SURGERY     left lung punctured-surgically repaired  . TOTAL ABDOMINAL HYSTERECTOMY     Family History  Problem Relation Age of Onset  .  Prostate cancer Father   . Hypertension Mother     brother also  . Lung cancer Sister   . Emphysema Sister   . Colon cancer Neg Hx    Social History   Occupational History  . Not on file.   Social History Main Topics  . Smoking status: Never Smoker  . Smokeless tobacco: Never Used  . Alcohol use No  . Drug use: No  . Sexual activity: Not on file    Tobacco Counseling Counseling given: Not Answered   Activities of Daily Living No flowsheet data found.  Immunizations and Health Maintenance  There is no immunization history on file for this patient. Health Maintenance Due  Topic Date Due  . TETANUS/TDAP  06/05/1959  . DEXA SCAN  06/04/2005  . PNA vac Low Risk Adult (1 of 2 - PCV13) 06/04/2005    Patient Care Team: Lauree Chandler, NP as PCP - General (Geriatric Medicine)  Indicate any recent Medical Services you may have received from other than [facility] providers in the past year (date may be approximate).     Assessment:   This is a routine wellness examination for Kimberly Mclean.   Hearing/Vision screen No exam data present  Dietary issues and exercise activities discussed:    Goals  None     Depression Screen PHQ 2/9 Scores 08/05/2016 06/30/2016  PHQ - 2 Score 1 3  PHQ- 9 Score - 15    Fall Risk Fall Risk  08/05/2016 06/30/2016  Falls in the past year? No No    Cognitive Function: MMSE - Mini Mental State Exam 06/30/2016  Orientation to time 3  Orientation to Place 4  Registration 3  Attention/ Calculation 0  Recall 0  Language- name 2 objects 2  Language- repeat 0  Language- follow 3 step command 2  Language- read & follow direction 1  Write a sentence 1  Copy design 0  Total score 16        Screening Tests Health Maintenance  Topic Date Due  . TETANUS/TDAP  06/05/1959  . DEXA SCAN  06/04/2005  . PNA vac Low Risk Adult (1 of 2 - PCV13) 06/04/2005  . INFLUENZA VACCINE  12/03/2016      Plan:  I have personally reviewed and addressed the  Medicare Annual Wellness questionnaire and have noted the following in the patient's chart:  A. Medical and social history B. Use of alcohol, tobacco or illicit drugs  C. Current medications and supplements D. Functional ability and status E.  Nutritional status F.  Physical activity G. Advance directives H. List of other physicians I.  Hospitalizations, surgeries, and ER visits in previous 12 months J.  [place] to include hearing, vision, cognitive, depression L. Referrals and appointments - none  In addition, I have reviewed and discussed with patient certain preventive protocols, quality metrics, and best practice recommendations. A written personalized care plan for preventive services as well as general preventive health recommendations were provided to patient.  See attached scanned questionnaire for additional information.   Signed,   Rich Reining, RN Nurse Health Advisor   I reviewed health advisors note, was available for consultation and agree with documentation and plan.  Carlos American. Harle Battiest  [facility] [phone] 8 am - 5 pm) [phone] (after hours)

## 2016-08-14 ENCOUNTER — Other Ambulatory Visit: Payer: Medicare HMO

## 2016-08-14 ENCOUNTER — Ambulatory Visit: Payer: Medicare HMO

## 2016-08-21 ENCOUNTER — Other Ambulatory Visit: Payer: Medicare HMO

## 2016-08-21 ENCOUNTER — Ambulatory Visit: Payer: Medicare HMO

## 2016-08-29 ENCOUNTER — Ambulatory Visit
Admission: RE | Admit: 2016-08-29 | Discharge: 2016-08-29 | Disposition: A | Discharge status: Home / Self Care | Payer: Medicare HMO | Source: Ambulatory Visit | Attending: Nurse Practitioner | Admitting: Nurse Practitioner

## 2016-08-29 DIAGNOSIS — M8580 Other specified disorders of bone density and structure, unspecified site: Secondary | ICD-10-CM

## 2016-08-29 DIAGNOSIS — Z1231 Encounter for screening mammogram for malignant neoplasm of breast: Secondary | ICD-10-CM

## 2016-09-01 ENCOUNTER — Encounter: Payer: Self-pay | Admitting: Nurse Practitioner

## 2016-09-01 ENCOUNTER — Ambulatory Visit (INDEPENDENT_AMBULATORY_CARE_PROVIDER_SITE_OTHER): Payer: Medicare HMO | Admitting: Nurse Practitioner

## 2016-09-01 VITALS — BP 164/86 | HR 60 | Temp 97.7°F | Resp 18 | Ht 61.0 in | Wt 117.8 lb

## 2016-09-01 DIAGNOSIS — Z0000 Encounter for general adult medical examination without abnormal findings: Secondary | ICD-10-CM | POA: Diagnosis not present

## 2016-09-01 DIAGNOSIS — I10 Essential (primary) hypertension: Secondary | ICD-10-CM | POA: Diagnosis not present

## 2016-09-01 DIAGNOSIS — M8580 Other specified disorders of bone density and structure, unspecified site: Secondary | ICD-10-CM | POA: Diagnosis not present

## 2016-09-01 DIAGNOSIS — N3281 Overactive bladder: Secondary | ICD-10-CM

## 2016-09-01 DIAGNOSIS — R413 Other amnesia: Secondary | ICD-10-CM

## 2016-09-01 LAB — POCT URINALYSIS DIPSTICK
BILIRUBIN UA: NEGATIVE
Blood, UA: NEGATIVE
GLUCOSE UA: NEGATIVE
Ketones, UA: NEGATIVE
LEUKOCYTES UA: NEGATIVE
NITRITE UA: NEGATIVE
Protein, UA: NEGATIVE
Spec Grav, UA: 1.025 (ref 1.010–1.025)
UROBILINOGEN UA: 0.2 U/dL
pH, UA: 5 (ref 5.0–8.0)

## 2016-09-01 LAB — TSH: TSH: 0.67 mIU/L

## 2016-09-01 MED ORDER — LOSARTAN POTASSIUM 100 MG PO TABS: 100.0000 mg | ORAL_TABLET | Freq: Every day | ORAL | 0 refills | Status: DC

## 2016-09-01 MED ORDER — GABAPENTIN 600 MG PO TABS: 600.0000 mg | ORAL_TABLET | Freq: Every day | ORAL | 1 refills | Status: DC

## 2016-09-01 MED ORDER — PREDNISONE 5 MG PO TABS: 5.0000 mg | ORAL_TABLET | Freq: Every day | ORAL | 0 refills | Status: DC

## 2016-09-01 MED ORDER — MIRABEGRON ER 25 MG PO TB24: 25.0000 mg | ORAL_TABLET | Freq: Every day | ORAL | 2 refills | Status: DC

## 2016-09-01 MED ORDER — AMLODIPINE BESYLATE 2.5 MG PO TABS: 2.5000 mg | ORAL_TABLET | Freq: Every day | ORAL | 1 refills | Status: DC

## 2016-09-01 NOTE — Progress Notes (Addendum)
Provider: Lauree Chandler, NP  Patient Care Team: Lauree Chandler, NP as PCP - General (Geriatric Medicine)  Extended Emergency Contact Information Primary Emergency Contact: Gareth [place] Phone: [phone] Relation: Relative Secondary Emergency Contact: Colson,Dot  [place] Mobile Phone: [phone] Relation: Other No Known Allergies Code Status: FULL  Goals of Care: Advanced Directive information Advanced Directives 09/01/2016  Does Patient Have a Medical Advance Directive? No  Would patient like information on creating a medical advance directive? -     Chief Complaint  Patient presents with  . Medical Management of Chronic Issues    Pt is being seen for an extended visit for follow up on chronic conditions.     HPI: Patient is a 76 y.o. female seen in today for an annual wellness exam.   Pt with MMSE of 16/30 at last visit. Son was called but unable to come to visit. She is here today and a very poor historian. Repeats that her overactive bladder is the root of all her issues. Needs samples because Rx is too expensive. Reports pain to bladder that goes all over. Keeps repeating herself in regards to overactive bladder Major illnesses or hospitalization in the last year- none.  Does not know if she took her medication today.   Depression screen [facility] 2/9 08/05/2016 06/30/2016  Decreased Interest 0 0  Down, Depressed, Hopeless 1 3  PHQ - 2 Score 1 3  Altered sleeping - 0  Tired, decreased energy - 3  Change in appetite - 0  Feeling bad or failure about yourself  - 3  Trouble concentrating - 3  Moving slowly or fidgety/restless - 3  Suicidal thoughts - 0  PHQ-9 Score - 15  Difficult doing work/chores - Very difficult    Fall Risk  09/01/2016 08/05/2016 06/30/2016  Falls in the past year? No No No   MMSE - Mini Mental State Exam 08/05/2016 06/30/2016  Not completed: (No Data) -  Orientation to time - 3  Orientation to  Place - 4  Registration - 3  Attention/ Calculation - 0  Recall - 0  Language- name 2 objects - 2  Language- repeat - 0  Language- follow 3 step command - 2  Language- read & follow direction - 1  Write a sentence - 1  Copy design - 0  Total score - 16     Health Maintenance  Topic Date Due  . PNA vac Low Risk Adult (1 of 2 - PCV13) 06/24/2018 (Originally 06/04/2005)  . TETANUS/TDAP  10/03/2018 (Originally 06/05/1959)  . INFLUENZA VACCINE  12/03/2016  . DEXA SCAN  Completed     Past Medical History:  Diagnosis Date  . Chronic renal disease, stage II   . Constipation   . Dementia   . Fibromyositis   . Glaucoma    right eye  . Hemorrhoids   . History of pneumothorax    left  . Hyperlipidemia   . Hypertension   . Hypertensive renal disease, benign   . Multiple rib fractures    Left side: eibs 3 through 6  . Osteopenia   . Osteoporosis   . Vitamin D deficiency     Past Surgical History:  Procedure Laterality Date  . BREAST EXCISIONAL BIOPSY Left 1993   no visible scar  . BREAST EXCISIONAL BIOPSY Left 1992   no visible scar  . BREAST SURGERY  1970's   removed 2 benign lumps  . LUNG SURGERY  left lung punctured-surgically repaired  . TOTAL ABDOMINAL HYSTERECTOMY      Social History   Social History  . Marital status: Single    Spouse name: N/A  . Number of children: N/A  . Years of education: N/A   Social History Main Topics  . Smoking status: Never Smoker  . Smokeless tobacco: Never Used  . Alcohol use No  . Drug use: No  . Sexual activity: Not Asked   Other Topics Concern  . None   Social History Narrative   Social History      Diet? no      Do you drink/eat things with caffeine? yes      Marital status?                                    What year were you married?      Do you live in a house, apartment, assisted living, condo, trailer, etc.? house      Is it one or more stories? 1 story      How many persons live in your home?  none      Do you have any pets in your home? (please list) no      Highest level of education completed? 12 grade      Current or past profession: [employer]. [employer]      Advanced Directives      Do you exercise?      Some time                                Type & how often? Leg and arm raises 2 x week      Do you have a living will? no      Do you have a DNR form?   no                               If not, do you want to discuss one? no      Do you have signed POA/HPOA for forms?       Functional Status      Do you have difficulty bathing or dressing yourself? no      Do you have difficulty preparing food or eating? no      Do you have difficulty managing your medications? no      Do you have difficulty managing your finances? no      Do you have difficulty affording your medications? no       Family History  Problem Relation Age of Onset  . Prostate cancer Father   . Hypertension Mother     brother also  . Lung cancer Sister   . Emphysema Sister   . Colon cancer Neg Hx     Review of Systems:  Review of Systems  Unable to perform ROS: Dementia     Allergies as of 09/01/2016   No Known Allergies     Medication List       Accurate as of 09/01/16 11:01 AM. Always use your most recent med list.          acetaminophen 325 MG tablet Commonly known as:  TYLENOL Take 650 mg by mouth every 6 (six) hours as needed.   amLODipine 2.5 MG  tablet Commonly known as:  NORVASC Take 2.5 mg by mouth daily.   cholecalciferol 1000 units tablet Commonly known as:  VITAMIN D Take 1,000 Units by mouth daily.   dorzolamide-timolol 22.3-6.8 MG/ML ophthalmic solution Commonly known as:  COSOPT INSTILL 1 DROP INTO BOTH EYES TWICE DAILY   gabapentin 600 MG tablet Commonly known as:  NEURONTIN Take 600 mg by mouth at bedtime.   latanoprost 0.005 % ophthalmic solution Commonly known as:  XALATAN INSTILL 1 DROP INTO BOTH EYES AT  BEDTIME   losartan 100 MG tablet Commonly known as:  COZAAR Take 100 mg by mouth daily.   predniSONE 5 MG tablet Commonly known as:  DELTASONE Take 5 mg by mouth daily.         Physical Exam: Vitals:   09/01/16 1050  BP: (!) 164/86  Pulse: 60  Resp: 18  Temp: 97.7 F (36.5 C)  TempSrc: Oral  SpO2: 98%  Weight: 117 lb 12.8 oz (53.4 kg)  Height: 5\' 1"  (1.549 m)   Body mass index is 22.26 kg/m. Physical Exam  Constitutional: She appears well-developed and well-nourished. No distress.  HENT:  Head: Normocephalic and atraumatic.  Right Ear: External ear normal.  Left Ear: External ear normal.  Nose: Nose normal.  Mouth/Throat: No oropharyngeal exudate.  Eyes: Conjunctivae are normal. Pupils are equal, round, and reactive to light.  Neck: Neck supple.  Cardiovascular: Normal rate, regular rhythm, normal heart sounds and intact distal pulses.   Pulmonary/Chest: Effort normal and breath sounds normal.  Abdominal: Soft. Bowel sounds are normal. She exhibits no distension and no mass. There is no tenderness. There is no rebound and no guarding.  Musculoskeletal: Normal range of motion. She exhibits no edema or tenderness.  Neurological: She is alert.  Skin: Skin is warm and dry. She is not diaphoretic.  Psychiatric: Her affect is inappropriate. Her speech is rapid and/or pressured. She is agitated and hyperactive. Cognition and memory are impaired. She expresses impulsivity.   limited exam due to agitation  Labs reviewed: Basic Metabolic Panel:  Recent Labs  07/24/16 0857  NA 141  K 4.2  CL 107  CO2 26  GLUCOSE 81  BUN 15  CREATININE 0.85  CALCIUM 9.2   Liver Function Tests:  Recent Labs  07/24/16 0857  AST 17  ALT 14  ALKPHOS 47  BILITOT 0.7  PROT 6.6  ALBUMIN 4.2   No results for input(s): LIPASE, AMYLASE in the last 8760 hours. No results for input(s): AMMONIA in the last 8760 hours. CBC:  Recent Labs  07/24/16 0857  WBC 4.6  NEUTROABS  2,438  HGB 13.1  HCT 41.2  MCV 88.2  PLT 279   Lipid Panel:  Recent Labs  07/24/16 0857  CHOL 255*  HDL 94  LDLCALC 144*  TRIG 86  CHOLHDL 2.7   No results found for: HGBA1C  Procedures: Dg Bone Density  Result Date: 08/29/2016 EXAM: DUAL X-RAY ABSORPTIOMETRY (DXA) FOR BONE MINERAL DENSITY IMPRESSION: Referring Physician:  Lauree Chandler PATIENT: Name: Kimberly Mclean, Kimberly Mclean Patient ID: [id number] Birth Date: [birth year] Height: 61.0 in. Sex: Female Measured: 08/29/2016 Weight: 117.3 lbs. Indications: Advanced Age, Estrogen Deficient, Hysterectomy, Low Calcium Intake (269.3), Postmenopausal Fractures: None Treatments: Vitamin D (E933.5) ASSESSMENT: The BMD measured at Femur Neck Right is 0.721 g/cm2 with a T-score of -2.3. This patient is considered osteopenic according to [facility]) criteria. Lumbar spine was not utilized due to advanced degenerative changes. Site Region Measured Date Measured Age YA BMD  Significant CHANGE T-score DualFemur Neck Right 08/29/2016 76.2 -2.3 0.721 g/cm2 Left Forearm Radius 33% 08/29/2016 76.2 -1.2 0.777 g/cm2 World Health [facility]) criteria for post-menopausal, Caucasian Women: Normal       T-score at or above -1 SD Osteopenia   T-score between -1 and -2.5 SD Osteoporosis T-score at or below -2.5 SD RECOMMENDATION: [place] recommends that FDA-approved medical therapies be considered in postmenopausal women and men age 78 or older with a: 1. Hip or vertebral (clinical or morphometric) fracture. 2. T-score of <-2.5 at the spine or hip. 3. Ten-year fracture probability by FRAX of 3% or greater for hip fracture or 20% or greater for major osteoporotic fracture. All treatment decisions require clinical judgment and consideration of individual patient factors, including patient preferences, co-morbidities, previous drug use, risk factors not captured in the FRAX model (e.g. falls, vitamin D deficiency, increased bone  turnover, interval significant decline in bone density) and possible under - or over-estimation of fracture risk by FRAX. All patients should ensure an adequate intake of dietary calcium (1200 mg/d) and vitamin D (800 IU daily) unless contraindicated. FOLLOW-UP: People with diagnosed cases of osteoporosis or at high risk for fracture should have regular bone mineral density tests. For patients eligible for Medicare, routine testing is allowed once every 2 years. The testing frequency can be increased to one year for patients who have rapidly progressing disease, those who are receiving or discontinuing medical therapy to restore bone mass, or have additional risk factors. I have reviewed this report, and agree with the above findings. [facility] Electronically Signed   By: Marijo Conception, M.D.   On: 08/29/2016 16:15   Mm Digital Screening Bilateral  Result Date: 08/29/2016 CLINICAL DATA:  Screening. EXAM: DIGITAL SCREENING BILATERAL MAMMOGRAM WITH CAD COMPARISON:  Previous exam(s). ACR Breast Density Category c: The breast tissue is heterogeneously dense, which may obscure small masses. FINDINGS: There are no findings suspicious for malignancy. Images were processed with CAD. IMPRESSION: No mammographic evidence of malignancy. A result letter of this screening mammogram will be mailed directly to the patient. RECOMMENDATION: Screening mammogram in one year. (Code:[id number]-B-01Y) BI-RADS CATEGORY  1: Negative. Electronically Signed   By: Margarette Canada M.D.   On: 08/29/2016 16:46    Assessment/Plan 1. Osteopenia, unspecified location -dexa reviewed, Recommended to take caltrate with D 600/400 twice a day for supplement with weight bearing exercise as tolerates.   2. Overactive bladder -samples of myrbetriq given,  mirabegron ER (MYRBETRIQ) 25 MG TB24 tablet; Take 1 tablet (25 mg total) by mouth daily.  Dispense: 90 tablet; Refill: 2 - POCT urinalysis dipstick  3. Essential hypertension -she is  unsure if she took medication today. Will get [facility] referral for medication management.  - amLODipine (NORVASC) 2.5 MG tablet; Take 1 tablet (2.5 mg total) by mouth daily.  Dispense: 90 tablet; Refill: 1 - losartan (COZAAR) 100 MG tablet; Take 1 tablet (100 mg total) by mouth daily.  Dispense: 90 tablet; Refill: 0 - EKG 12-Lead done- nsr  4. Memory loss -may need neurology referral. Unknown previous workup, records sent to office were from 2012 [facility] referral placed due to medication and disease management.  -no family available at visit, placed calls to family listed on emergency contact list but no answer, left msg for son to call back.  - RPR - TSH - [facility] CM Comm to Care Management - POCT urinalysis dipstick  5. Wellness examination Pt with memory loss however family unable to come to visit.  msg left for son.  -pt declines any vaccines at this time.  -blood pressure elevated but unsure if she has taken her medication this time.  [facility] referral placed.

## 2016-09-01 NOTE — Patient Instructions (Addendum)
-   dexa scan revealed osteopenia -- should take an adequate intake of dietary calcium (1200 mg/d) and vitamin D (800 IU daily) -Recommended to take caltrate with D 600/400 twice a day.   To take myrbetriq 25 mg for overactive bladder Will check for a urinary tract infection as well.   Follow up in 3 months with Dr Mariea Clonts

## 2016-09-02 LAB — RPR

## 2016-09-09 ENCOUNTER — Telehealth: Payer: Self-pay

## 2016-09-09 NOTE — Telephone Encounter (Signed)
I called patient to ask if she can sign another medical release form so that we can obtain her records from the [facility].   I have been unable to reach patient and no voicemail picks up, so I cannot leave a message. Will try again later.

## 2016-09-15 ENCOUNTER — Other Ambulatory Visit: Payer: Self-pay | Admitting: *Deleted

## 2016-09-15 DIAGNOSIS — N3281 Overactive bladder: Secondary | ICD-10-CM

## 2016-09-15 MED ORDER — GABAPENTIN 600 MG PO TABS: 600.0000 mg | ORAL_TABLET | Freq: Every day | ORAL | 3 refills | Status: DC

## 2016-09-15 MED ORDER — MIRABEGRON ER 25 MG PO TB24: 25.0000 mg | ORAL_TABLET | Freq: Every day | ORAL | 3 refills | Status: DC

## 2016-09-15 NOTE — Telephone Encounter (Signed)
[facility] 

## 2016-09-15 NOTE — Telephone Encounter (Signed)
I spoke with patient and she agreed to sign another medical release. Patient asked that the form be mailed to her and she would sign and mail back to the office.   Form placed in outgoing mail and will be picked up tomorrow.

## 2016-09-16 NOTE — Telephone Encounter (Signed)
Medical release form was mailed to patient today with a note asking her to sign and return to the office.

## 2016-09-19 ENCOUNTER — Other Ambulatory Visit: Payer: Self-pay | Admitting: *Deleted

## 2016-09-19 DIAGNOSIS — I10 Essential (primary) hypertension: Secondary | ICD-10-CM

## 2016-09-19 MED ORDER — AMLODIPINE BESYLATE 2.5 MG PO TABS: 2.5000 mg | ORAL_TABLET | Freq: Every day | ORAL | 1 refills | Status: DC

## 2016-09-19 MED ORDER — PREDNISONE 5 MG PO TABS: 5.0000 mg | ORAL_TABLET | Freq: Every day | ORAL | 0 refills | Status: DC

## 2016-09-19 NOTE — Telephone Encounter (Signed)
[facility] 

## 2016-10-03 ENCOUNTER — Emergency Department (HOSPITAL_COMMUNITY)
Admission: EM | Admit: 2016-10-03 | Discharge: 2016-10-03 | Disposition: A | Discharge status: Home / Self Care | Payer: Medicare Other | Attending: Emergency Medicine | Admitting: Emergency Medicine

## 2016-10-03 ENCOUNTER — Encounter (HOSPITAL_COMMUNITY): Payer: Self-pay | Admitting: Emergency Medicine

## 2016-10-03 DIAGNOSIS — Z79899 Other long term (current) drug therapy: Secondary | ICD-10-CM | POA: Diagnosis not present

## 2016-10-03 DIAGNOSIS — N182 Chronic kidney disease, stage 2 (mild): Secondary | ICD-10-CM | POA: Diagnosis not present

## 2016-10-03 DIAGNOSIS — I129 Hypertensive chronic kidney disease with stage 1 through stage 4 chronic kidney disease, or unspecified chronic kidney disease: Secondary | ICD-10-CM | POA: Insufficient documentation

## 2016-10-03 DIAGNOSIS — R319 Hematuria, unspecified: Secondary | ICD-10-CM | POA: Insufficient documentation

## 2016-10-03 LAB — CBC
HEMATOCRIT: 40.2 % (ref 36.0–46.0)
HEMOGLOBIN: 12.8 g/dL (ref 12.0–15.0)
MCH: 27.7 pg (ref 26.0–34.0)
MCHC: 31.8 g/dL (ref 30.0–36.0)
MCV: 87 fL (ref 78.0–100.0)
Platelets: 232 10*3/uL (ref 150–400)
RBC: 4.62 MIL/uL (ref 3.87–5.11)
RDW: 12.8 % (ref 11.5–15.5)
WBC: 5 10*3/uL (ref 4.0–10.5)

## 2016-10-03 LAB — URINALYSIS, ROUTINE W REFLEX MICROSCOPIC
Bilirubin Urine: NEGATIVE
GLUCOSE, UA: NEGATIVE mg/dL
Ketones, ur: NEGATIVE mg/dL
Leukocytes, UA: NEGATIVE
Nitrite: NEGATIVE
PROTEIN: 100 mg/dL — AB
SQUAMOUS EPITHELIAL / LPF: NONE SEEN
Specific Gravity, Urine: 1.003 — ABNORMAL LOW (ref 1.005–1.030)
pH: 7 (ref 5.0–8.0)

## 2016-10-03 LAB — BASIC METABOLIC PANEL
ANION GAP: 8 (ref 5–15)
BUN: 12 mg/dL (ref 6–20)
CO2: 23 mmol/L (ref 22–32)
Calcium: 9.2 mg/dL (ref 8.9–10.3)
Chloride: 107 mmol/L (ref 101–111)
Creatinine, Ser: 0.99 mg/dL (ref 0.44–1.00)
GFR calc Af Amer: >60 mL/min (ref >60)
GFR, EST NON AFRICAN AMERICAN: 54 mL/min — AB (ref >60)
Glucose, Bld: 136 mg/dL — ABNORMAL HIGH (ref 65–99)
POTASSIUM: 3.8 mmol/L (ref 3.5–5.1)
SODIUM: 138 mmol/L (ref 135–145)

## 2016-10-03 NOTE — ED Provider Notes (Signed)
[facility] DEPT Provider Note   CSN: [id number] Arrival date & time: 10/03/16  1316     History   Chief Complaint Chief Complaint  Patient presents with  . Hematuria    HPI Kimberly Mclean is a 76 y.o. female.   Hematuria  This is a new problem. The current episode started yesterday. The problem occurs constantly. The problem has not changed since onset.Pertinent negatives include no chest pain, no abdominal pain and no shortness of breath. Nothing aggravates the symptoms. Nothing relieves the symptoms. She has tried nothing for the symptoms. The treatment provided no relief.    Past Medical History:  Diagnosis Date  . Chronic renal disease, stage II   . Constipation   . Dementia   . Fibromyositis   . Glaucoma    right eye  . Hemorrhoids   . History of pneumothorax    left  . Hyperlipidemia   . Hypertension   . Hypertensive renal disease, benign   . Multiple rib fractures    Left side: eibs 3 through 6  . Osteopenia   . Osteoporosis   . Vitamin D deficiency     Patient Active Problem List   Diagnosis Date Noted  . Diverticulosis 01/16/2012  . Hemorrhoids 01/16/2012  . HTN (hypertension) 01/16/2012    Past Surgical History:  Procedure Laterality Date  . BREAST EXCISIONAL BIOPSY Left 1993   no visible scar  . BREAST EXCISIONAL BIOPSY Left 1992   no visible scar  . BREAST SURGERY  1970's   removed 2 benign lumps  . LUNG SURGERY     left lung punctured-surgically repaired  . TOTAL ABDOMINAL HYSTERECTOMY      OB History    No data available       Home Medications    Prior to Admission medications   Medication Sig Start Date End Date Taking? Authorizing Provider  acetaminophen (TYLENOL) 325 MG tablet Take 650 mg by mouth every 6 (six) hours as needed.    [provider]  amLODipine (NORVASC) 2.5 MG tablet Take 1 tablet (2.5 mg total) by mouth daily. 09/19/16   Lauree Chandler, NP  cholecalciferol (VITAMIN D) 1000 units tablet Take  1,000 Units by mouth daily.    [provider]  dorzolamide-timolol (COSOPT) 22.3-6.8 MG/ML ophthalmic solution INSTILL 1 DROP INTO BOTH EYES TWICE DAILY 12/13/15   [provider]  gabapentin (NEURONTIN) 600 MG tablet Take 1 tablet (600 mg total) by mouth at bedtime. 09/15/16   Estill Dooms, MD  latanoprost (XALATAN) 0.005 % ophthalmic solution INSTILL 1 DROP INTO BOTH EYES AT BEDTIME 03/10/16   [provider]  losartan (COZAAR) 100 MG tablet Take 1 tablet (100 mg total) by mouth daily. 09/01/16   Lauree Chandler, NP  mirabegron ER (MYRBETRIQ) 25 MG TB24 tablet Take 1 tablet (25 mg total) by mouth daily. 09/15/16   Estill Dooms, MD  predniSONE (DELTASONE) 5 MG tablet Take 1 tablet (5 mg total) by mouth daily. 09/19/16   Lauree Chandler, NP    Family History Family History  Problem Relation Age of Onset  . Prostate cancer Father   . Hypertension Mother        brother also  . Lung cancer Sister   . Emphysema Sister   . Colon cancer Neg Hx     Social History Social History  Substance Use Topics  . Smoking status: Never Smoker  . Smokeless tobacco: Never Used  . Alcohol use No  Allergies   Patient has no known allergies.   Review of Systems Review of Systems  Constitutional: Negative for fever.  Eyes: Negative for visual disturbance.  Respiratory: Negative for chest tightness and shortness of breath.   Cardiovascular: Negative for chest pain.  Gastrointestinal: Negative for abdominal pain, blood in stool and vomiting.  Endocrine: Negative for polyuria.  Genitourinary: Positive for hematuria and urgency. Negative for dysuria and flank pain.  Musculoskeletal: Negative for back pain.  Skin: Negative for rash.  Neurological: Negative for weakness and light-headedness.  Psychiatric/Behavioral: Negative for behavioral problems.     Physical Exam Updated Vital Signs BP (!) 187/96 (BP Location: Right Arm)   Pulse 80   Temp 97.9 F (36.6  C) (Oral)   Resp 16   SpO2 100%   Physical Exam  Constitutional: She is oriented to person, place, and time. She appears well-developed and well-nourished.  HENT:  Head: Atraumatic.  Mouth/Throat: Oropharynx is clear and moist.  Eyes: Conjunctivae and EOM are normal.  Neck: Normal range of motion. Neck supple.  Cardiovascular: Normal rate, regular rhythm, normal heart sounds and intact distal pulses.   Pulmonary/Chest: Effort normal and breath sounds normal. No respiratory distress.  Abdominal: She exhibits no distension and no mass. There is no tenderness. There is no guarding.  No CVA tenderness to palpation.  Musculoskeletal: Normal range of motion.  Neurological: She is alert and oriented to person, place, and time.  Skin: Skin is warm. Capillary refill takes less than 2 seconds. No pallor.  Psychiatric: She has a normal mood and affect.     ED Treatments / Results  Labs (all labs ordered are listed, but only abnormal results are displayed) Labs Reviewed  URINALYSIS, ROUTINE W REFLEX MICROSCOPIC - Abnormal; Notable for the following:       Result Value   Color, Urine AMBER (*)    Specific Gravity, Urine 1.003 (*)    Hgb urine dipstick LARGE (*)    Protein, ur 100 (*)    Bacteria, UA RARE (*)    All other components within normal limits  BASIC METABOLIC PANEL - Abnormal; Notable for the following:    Glucose, Bld 136 (*)    GFR calc non Af Amer 54 (*)    All other components within normal limits  CBC    EKG  EKG Interpretation None       Radiology No results found.  Procedures Procedures (including critical care time)  Medications Ordered in ED Medications - No data to display   Initial Impression / Assessment and Plan / ED Course  I have reviewed the triage vital signs and the nursing notes.  Pertinent labs & imaging results that were available during my care of the patient were reviewed by me and considered in my medical decision making (see chart  for details).     Patient is a 76 year old female with past history significant for overactive bladder, who presents to the emergency department with painless hematuria for 2 days. No melena or blood in her stool. No anticoagulation use. No abdominal pain or back pain. On arrival distress, not ill-appearing. Afebrile, he didn't be stable. Benign abdominal exam. No CVA tenderness.  UA showed large blood without signs of infection. No significant electrolyte abnormalities. Creatinine 0.99. Hemoglobin 12.8. Clinical picture is not consistent with cystitis or pyelonephritis. No other signs or symptoms of bleeding.  Patient is stable for discharge home. Discussed follow-up with her primary care physician for referral to urology, likely will need a  cystoscopy for further evaluation. Given strict return precautions to the emergency department. Patient expressed understanding, no questions or concerns at time of discharge.   Final Clinical Impressions(s) / ED Diagnoses   Final diagnoses:  Hematuria, unspecified type    New Prescriptions New Prescriptions   No medications on file     Nathaniel Man, MD 10/03/16 [place] Reusing    Carmin Muskrat, MD 10/05/16 [phone]

## 2016-10-03 NOTE — ED Triage Notes (Signed)
Pt reports hematuria that began this am, denies any other urinary symptoms.

## 2016-10-06 ENCOUNTER — Encounter: Payer: Self-pay | Admitting: Nurse Practitioner

## 2016-10-06 ENCOUNTER — Ambulatory Visit (INDEPENDENT_AMBULATORY_CARE_PROVIDER_SITE_OTHER): Payer: Medicare Other | Admitting: Nurse Practitioner

## 2016-10-06 VITALS — BP 142/82 | HR 63 | Temp 98.3°F | Resp 17 | Ht 61.0 in | Wt 115.2 lb

## 2016-10-06 DIAGNOSIS — R300 Dysuria: Secondary | ICD-10-CM | POA: Diagnosis not present

## 2016-10-06 DIAGNOSIS — R310 Gross hematuria: Secondary | ICD-10-CM

## 2016-10-06 LAB — CBC WITH DIFFERENTIAL/PLATELET
BASOS PCT: 0 %
Basophils Absolute: 0 cells/uL (ref 0–200)
EOS ABS: 0 {cells}/uL — AB (ref 15–500)
Eosinophils Relative: 0 %
HEMATOCRIT: 41.2 % (ref 35.0–45.0)
HEMOGLOBIN: 13.2 g/dL (ref 11.7–15.5)
LYMPHS ABS: 1224 {cells}/uL (ref 850–3900)
LYMPHS PCT: 24 %
MCH: 28.1 pg (ref 27.0–33.0)
MCHC: 32 g/dL (ref 32.0–36.0)
MCV: 87.7 fL (ref 80.0–100.0)
MONO ABS: 510 {cells}/uL (ref 200–950)
MPV: 9.6 fL (ref 7.5–12.5)
Monocytes Relative: 10 %
NEUTROS ABS: 3366 {cells}/uL (ref 1500–7800)
Neutrophils Relative %: 66 %
Platelets: 269 10*3/uL (ref 140–400)
RBC: 4.7 MIL/uL (ref 3.80–5.10)
RDW: 13.2 % (ref 11.0–15.0)
WBC: 5.1 10*3/uL (ref 3.8–10.8)

## 2016-10-06 MED ORDER — PREDNISONE 5 MG PO TABS: 5.0000 mg | ORAL_TABLET | Freq: Every day | ORAL | 0 refills | Status: DC

## 2016-10-06 NOTE — Progress Notes (Signed)
Careteam: Patient Care Team: Lauree Chandler, NP as PCP - General (Geriatric Medicine)  Advanced Directive information Does Patient Have a Medical Advance Directive?: No  No Known Allergies  Chief Complaint  Patient presents with  . Follow-up    Pt is being seen for ED follow up. Pt was seen at [facility] ED on 10/03/16 due to hematuria.      HPI: Patient is a 76 y.o. female seen in the office today due to painless hematuria. Was seen in [place] on 10/03/16 due to painless hematuria for 2 days and told to follow up with PCP for referral to urologist.  UA showed large hgb but without signs of infection. CBC WNL. Good appetite. No fevers or chills.  Reports she is in a lot of pain when she is urinating.  Increase frequency.   Review of Systems:  Review of Systems  Constitutional: Negative for chills, fever and weight loss.  HENT: Negative for tinnitus.   Respiratory: Negative for sputum production and shortness of breath.   Gastrointestinal: Negative for abdominal pain, constipation, diarrhea and heartburn.  Genitourinary: Positive for dysuria, flank pain, frequency, hematuria and urgency.  Musculoskeletal: Negative for back pain, falls and joint pain.  Skin: Negative.   Neurological: Positive for dizziness (occasional ). Negative for headaches.    Past Medical History:  Diagnosis Date  . Chronic renal disease, stage II   . Constipation   . Dementia   . Fibromyositis   . Glaucoma    right eye  . Hemorrhoids   . History of pneumothorax    left  . Hyperlipidemia   . Hypertension   . Hypertensive renal disease, benign   . Multiple rib fractures    Left side: eibs 3 through 6  . Osteopenia   . Osteoporosis   . Vitamin D deficiency    Past Surgical History:  Procedure Laterality Date  . BREAST EXCISIONAL BIOPSY Left 1993   no visible scar  . BREAST EXCISIONAL BIOPSY Left 1992   no visible scar  . BREAST SURGERY  1970's   removed 2 benign lumps  . LUNG SURGERY       left lung punctured-surgically repaired  . TOTAL ABDOMINAL HYSTERECTOMY     Social History:   reports that she has never smoked. She has never used smokeless tobacco. She reports that she does not drink alcohol or use drugs.  Family History  Problem Relation Age of Onset  . Prostate cancer Father   . Hypertension Mother        brother also  . Lung cancer Sister   . Emphysema Sister   . Colon cancer Neg Hx     Medications: Patient's Medications  New Prescriptions   No medications on file  Previous Medications   ACETAMINOPHEN (TYLENOL) 325 MG TABLET    Take 650 mg by mouth every 6 (six) hours as needed.   AMLODIPINE (NORVASC) 2.5 MG TABLET    Take 1 tablet (2.5 mg total) by mouth daily.   CHOLECALCIFEROL (VITAMIN D) 1000 UNITS TABLET    Take 1,000 Units by mouth daily.   DORZOLAMIDE-TIMOLOL (COSOPT) 22.3-6.8 MG/ML OPHTHALMIC SOLUTION    INSTILL 1 DROP INTO BOTH EYES TWICE DAILY   GABAPENTIN (NEURONTIN) 600 MG TABLET    Take 1 tablet (600 mg total) by mouth at bedtime.   LATANOPROST (XALATAN) 0.005 % OPHTHALMIC SOLUTION    INSTILL 1 DROP INTO BOTH EYES AT BEDTIME   LOSARTAN (COZAAR) 100 MG TABLET  Take 1 tablet (100 mg total) by mouth daily.   MIRABEGRON ER (MYRBETRIQ) 25 MG TB24 TABLET    Take 1 tablet (25 mg total) by mouth daily.   PREDNISONE (DELTASONE) 5 MG TABLET    Take 1 tablet (5 mg total) by mouth daily.  Modified Medications   No medications on file  Discontinued Medications   No medications on file     Physical Exam:  Vitals:   10/06/16 1357  BP: (!) 142/82  Pulse: 63  Resp: 17  Temp: 98.3 F (36.8 C)  TempSrc: Oral  SpO2: 97%  Weight: 115 lb 3.2 oz (52.3 kg)  Height: 5\' 1"  (1.549 m)   Body mass index is 21.77 kg/m.  Physical Exam  Constitutional: She appears well-developed and well-nourished. No distress.  HENT:  Head: Normocephalic and atraumatic.  Eyes: Conjunctivae are normal. Pupils are equal, round, and reactive to light.  Neck: Neck  supple.  Cardiovascular: Normal rate, regular rhythm and normal heart sounds.   Pulmonary/Chest: Effort normal and breath sounds normal.  Abdominal: Soft. Bowel sounds are normal. She exhibits no distension and no mass. There is tenderness. There is CVA tenderness. There is no guarding.  Neurological: She is alert.  Skin: Skin is warm and dry. She is not diaphoretic.    Labs reviewed: Basic Metabolic Panel:  Recent Labs  07/24/16 0857 09/01/16 1151 10/03/16 1326  NA 141  --  138  K 4.2  --  3.8  CL 107  --  107  CO2 26  --  23  GLUCOSE 81  --  136*  BUN 15  --  12  CREATININE 0.85  --  0.99  CALCIUM 9.2  --  9.2  TSH  --  0.67  --    Liver Function Tests:  Recent Labs  07/24/16 0857  AST 17  ALT 14  ALKPHOS 47  BILITOT 0.7  PROT 6.6  ALBUMIN 4.2   No results for input(s): LIPASE, AMYLASE in the last 8760 hours. No results for input(s): AMMONIA in the last 8760 hours. CBC:  Recent Labs  07/24/16 0857 10/03/16 1326  WBC 4.6 5.0  NEUTROABS 2,438  --   HGB 13.1 12.8  HCT 41.2 40.2  MCV 88.2 87.0  PLT 279 232   Lipid Panel:  Recent Labs  07/24/16 0857  CHOL 255*  HDL 94  LDLCALC 144*  TRIG 86  CHOLHDL 2.7   TSH:  Recent Labs  09/01/16 1151  TSH 0.67   A1C: No results found for: HGBA1C   Assessment/Plan 1. Gross hematuria -ongoing, reports dark frequent painful urine, UA in ED negative for infection and advised follow up today. pts symptoms have persisted and now complaining of worsening pain with urination.  - CBC with Differential/Platelets - Ambulatory referral to Urology for ongoing evaluation of blood in urine.  - Culture, Urine  2. Dysuria -will get UA with Culture, Urine  Discussed to return to ED if symptoms worsen   Trayton Szabo K. Harle Battiest  [facility] [phone] 8 am - 5 pm) [phone] (after hours)

## 2016-10-06 NOTE — Patient Instructions (Addendum)
Make sure to stay hydrated Referral placed for urologist, we will call you with your appt time and date.

## 2016-10-09 LAB — URINE CULTURE

## 2016-10-14 DIAGNOSIS — R413 Other amnesia: Secondary | ICD-10-CM | POA: Insufficient documentation

## 2016-10-14 DIAGNOSIS — M810 Age-related osteoporosis without current pathological fracture: Secondary | ICD-10-CM | POA: Insufficient documentation

## 2016-10-28 ENCOUNTER — Ambulatory Visit: Payer: Medicare Other | Admitting: Internal Medicine

## 2016-12-07 ENCOUNTER — Other Ambulatory Visit: Payer: Self-pay | Admitting: Nurse Practitioner

## 2016-12-08 ENCOUNTER — Ambulatory Visit
Admission: RE | Admit: 2016-12-08 | Discharge: 2016-12-08 | Disposition: A | Discharge status: Home / Self Care | Payer: Medicare Other | Source: Ambulatory Visit | Attending: Nurse Practitioner | Admitting: Nurse Practitioner

## 2016-12-08 ENCOUNTER — Ambulatory Visit (INDEPENDENT_AMBULATORY_CARE_PROVIDER_SITE_OTHER): Payer: Medicare Other | Admitting: Nurse Practitioner

## 2016-12-08 ENCOUNTER — Other Ambulatory Visit: Payer: Self-pay

## 2016-12-08 VITALS — BP 134/80 | HR 71 | Temp 97.9°F | Resp 18 | Ht 61.0 in | Wt 114.4 lb

## 2016-12-08 DIAGNOSIS — G5600 Carpal tunnel syndrome, unspecified upper limb: Secondary | ICD-10-CM | POA: Insufficient documentation

## 2016-12-08 DIAGNOSIS — N3281 Overactive bladder: Secondary | ICD-10-CM | POA: Diagnosis not present

## 2016-12-08 DIAGNOSIS — G5603 Carpal tunnel syndrome, bilateral upper limbs: Secondary | ICD-10-CM

## 2016-12-08 DIAGNOSIS — M542 Cervicalgia: Secondary | ICD-10-CM

## 2016-12-08 DIAGNOSIS — I10 Essential (primary) hypertension: Secondary | ICD-10-CM

## 2016-12-08 MED ORDER — GABAPENTIN 600 MG PO TABS: 600.0000 mg | ORAL_TABLET | Freq: Every day | ORAL | 1 refills | Status: DC

## 2016-12-08 MED ORDER — PREDNISONE 5 MG PO TABS: 5.0000 mg | ORAL_TABLET | Freq: Every day | ORAL | 1 refills | Status: DC

## 2016-12-08 MED ORDER — MIRABEGRON ER 25 MG PO TB24: 25.0000 mg | ORAL_TABLET | Freq: Every day | ORAL | 1 refills | Status: DC

## 2016-12-08 MED ORDER — AMLODIPINE BESYLATE 2.5 MG PO TABS: 2.5000 mg | ORAL_TABLET | Freq: Every day | ORAL | 3 refills | Status: DC

## 2016-12-08 MED ORDER — LOSARTAN POTASSIUM 100 MG PO TABS: 100.0000 mg | ORAL_TABLET | Freq: Every day | ORAL | 1 refills | Status: DC

## 2016-12-08 MED ORDER — LOSARTAN POTASSIUM 100 MG PO TABS: 100.0000 mg | ORAL_TABLET | Freq: Every day | ORAL | 3 refills | Status: DC

## 2016-12-08 NOTE — Telephone Encounter (Signed)
Received fax from [facility]:

## 2016-12-08 NOTE — Progress Notes (Signed)
Careteam: Patient Care Team: Lauree Chandler, NP as PCP - General (Geriatric Medicine)  Advanced Directive information Does Patient Have a Medical Advance Directive?: No  No Known Allergies  Chief Complaint  Patient presents with  . Acute Visit    Pt is being seen for bilateral hand numbness/reddness x 1-2 weeks.      HPI: Patient is a 76 y.o. female seen in the office today due to numbness and tingling in bilateral hands.  Reports this started last week of note pt hx of Carpal Tunnel Syndrome and on gabapentin. Has been on gabapentin for several years for nerve pain but states this does not help. Reports she can "see the blood on her hands"  However hand are visible normal.  Has tired tylenol but this does not help.   also has neck pain. Stooped posture.  Reports she is weak and dropping things out of her hands.   She is following with urologist due to hematuria- work up to date has been negative.  Review of Systems:  Review of Systems  Constitutional: Positive for weight loss. Negative for chills and fever.  HENT: Negative for tinnitus.   Respiratory: Negative for cough, sputum production and shortness of breath.   Cardiovascular: Negative for chest pain, palpitations and leg swelling.  Genitourinary: Positive for hematuria.  Musculoskeletal: Positive for myalgias and neck pain. Negative for back pain, falls and joint pain.  Skin: Negative.   Neurological: Positive for tingling and sensory change. Negative for dizziness and headaches.  Psychiatric/Behavioral: Positive for memory loss. Negative for depression. The patient does not have insomnia.     Past Medical History:  Diagnosis Date  . Anemia   . Anxiety   . Aortic atherosclerosis ([place])   . Arthritis   . Back pain   . Chest pain   . Chronic renal disease, stage II   . Constipation   . Dementia    frontemporal  . Depression   . Fibromyositis   . Glaucoma    right eye  . Gross hematuria   . Hair loss    . Hemorrhoids   . History of pneumothorax    left  . Horseshoe kidney   . Hyperlipidemia   . Hypertension   . Hypertensive renal disease, benign   . Malignant neoplasm of colon ([place])   . Multiple rib fractures    Left side: eibs 3 through 6  . Osteopenia   . Osteoporosis   . Personal history of DVT (deep vein thrombosis)   . Problems with hearing   . Renal cyst   . Urinary urgency   . Visual problems   . Vitamin D deficiency    Past Surgical History:  Procedure Laterality Date  . BREAST EXCISIONAL BIOPSY Left 1993   no visible scar  . BREAST EXCISIONAL BIOPSY Left 1992   no visible scar  . BREAST SURGERY  1970's   removed 2 benign lumps  . LUNG SURGERY     left lung punctured-surgically repaired  . TOTAL ABDOMINAL HYSTERECTOMY     Social History:   reports that she has never smoked. She has never used smokeless tobacco. She reports that she does not drink alcohol or use drugs.  Family History  Problem Relation Age of Onset  . Prostate cancer Father   . Hypertension Mother        brother also  . Emphysema Mother   . Lung cancer Sister   . Colon cancer Neg Hx  Medications: Patient's Medications  New Prescriptions   No medications on file  Previous Medications   ACETAMINOPHEN (TYLENOL) 325 MG TABLET    Take 650 mg by mouth every 6 (six) hours as needed.   AMLODIPINE (NORVASC) 2.5 MG TABLET    Take 1 tablet (2.5 mg total) by mouth daily.   CHOLECALCIFEROL (VITAMIN D) 1000 UNITS TABLET    Take 1,000 Units by mouth daily.   DORZOLAMIDE-TIMOLOL (COSOPT) 22.3-6.8 MG/ML OPHTHALMIC SOLUTION    INSTILL 1 DROP INTO BOTH EYES TWICE DAILY   GABAPENTIN (NEURONTIN) 600 MG TABLET    Take 1 tablet (600 mg total) by mouth at bedtime.   LATANOPROST (XALATAN) 0.005 % OPHTHALMIC SOLUTION    INSTILL 1 DROP INTO BOTH EYES AT BEDTIME   LOSARTAN (COZAAR) 100 MG TABLET    Take 1 tablet (100 mg total) by mouth daily.   MIRABEGRON ER (MYRBETRIQ) 25 MG TB24 TABLET    Take 1 tablet (25  mg total) by mouth daily.   PREDNISONE (DELTASONE) 5 MG TABLET    TAKE 1 TABLET BY MOUTH  DAILY  Modified Medications   No medications on file  Discontinued Medications   No medications on file     Physical Exam:  Vitals:   12/08/16 1426  BP: 134/80  Pulse: 71  Resp: 18  Temp: 97.9 F (36.6 C)  TempSrc: Oral  SpO2: 98%  Weight: 114 lb 6.4 oz (51.9 kg)  Height: 5\' 1"  (1.549 m)   Body mass index is 21.62 kg/m.  Physical Exam  Constitutional: She appears well-developed and well-nourished. No distress.  HENT:  Head: Normocephalic and atraumatic.  Right Ear: External ear normal.  Left Ear: External ear normal.  Nose: Nose normal.  Mouth/Throat: No oropharyngeal exudate.  Eyes: Pupils are equal, round, and reactive to light. Conjunctivae are normal.  Neck: Neck supple.  Cardiovascular: Normal rate, regular rhythm, normal heart sounds and intact distal pulses.   Pulmonary/Chest: Effort normal and breath sounds normal.  Abdominal: Soft. Bowel sounds are normal. She exhibits no distension and no mass. There is no tenderness. There is no rebound and no guarding.  Musculoskeletal: Normal range of motion. She exhibits tenderness. She exhibits no edema.       Cervical back: She exhibits normal range of motion and no tenderness.  Kyphosis Grip strength normal bilaterally Decrease sensation to monofilament on left hand/fingertips   Neurological: She is alert.  Skin: Skin is warm and dry. She is not diaphoretic.  Psychiatric: Her speech is rapid and/or pressured. She expresses impulsivity.    Labs reviewed: Basic Metabolic Panel:  Recent Labs  07/24/16 0857 09/01/16 1151 10/03/16 1326  NA 141  --  138  K 4.2  --  3.8  CL 107  --  107  CO2 26  --  23  GLUCOSE 81  --  136*  BUN 15  --  12  CREATININE 0.85  --  0.99  CALCIUM 9.2  --  9.2  TSH  --  0.67  --    Liver Function Tests:  Recent Labs  07/24/16 0857  AST 17  ALT 14  ALKPHOS 47  BILITOT 0.7  PROT 6.6    ALBUMIN 4.2   No results for input(s): LIPASE, AMYLASE in the last 8760 hours. No results for input(s): AMMONIA in the last 8760 hours. CBC:  Recent Labs  07/24/16 0857 10/03/16 1326 10/06/16 1457  WBC 4.6 5.0 5.1  NEUTROABS 2,438  --  3,366  HGB 13.1 12.8  13.2  HCT 41.2 40.2 41.2  MCV 88.2 87.0 87.7  PLT 279 232 269   Lipid Panel:  Recent Labs  07/24/16 0857  CHOL 255*  HDL 94  LDLCALC 144*  TRIG 86  CHOLHDL 2.7   TSH:  Recent Labs  09/01/16 1151  TSH 0.67   A1C: Lab Results  Component Value Date   HGBA1C 5.3 11/17/2014     Assessment/Plan 1. Essential hypertension -stable on losartan and amlodipine.  - losartan (COZAAR) 100 MG tablet; Take 1 tablet (100 mg total) by mouth daily.  Dispense: 90 tablet; Refill: 1  2. Overactive bladder -doing well on myrbetriq  - mirabegron ER (MYRBETRIQ) 25 MG TB24 tablet; Take 1 tablet (25 mg total) by mouth daily.  Dispense: 90 tablet; Refill: 1  3. Bilateral carpal tunnel syndrome Pt report neuropathy is new however this has been a chronic complaint since she established care and has hx of Carpal Tunnel Syndrome.  Reviewed pervious PCP records and she was placed on prednisone and gabapentin for this in the past. Has also had PT recommended but did not go for this.  - gabapentin (NEURONTIN) 600 MG tablet; Take 1 tablet (600 mg total) by mouth at bedtime.  Dispense: 90 tablet; Refill: 1 - predniSONE (DELTASONE) 5 MG tablet; Take 1 tablet (5 mg total) by mouth daily.  Dispense: 90 tablet; Refill: 1  4. Neuropathy and neck pain With complaints of neck pain and abnormal posture, will get  DG Cervical Spine Complete; Future to evaluate to see if this could be the cause of weakness/pain.   Next appt: 6 weeks with Dr Eulas Post for physician visit Carlos American. Harle Battiest  [facility] [phone] 8 am - 5 pm) [phone] (after hours)

## 2016-12-08 NOTE — Telephone Encounter (Signed)
Received Rx request from [facility], done.

## 2016-12-08 NOTE — Patient Instructions (Addendum)
To get xray and we will give further instructions based on this.    Follow up with Dr Eulas Post in 6 weeks

## 2016-12-09 ENCOUNTER — Other Ambulatory Visit: Payer: Self-pay | Admitting: Nurse Practitioner

## 2016-12-09 DIAGNOSIS — R937 Abnormal findings on diagnostic imaging of other parts of musculoskeletal system: Secondary | ICD-10-CM

## 2016-12-18 ENCOUNTER — Other Ambulatory Visit: Payer: Self-pay | Admitting: *Deleted

## 2016-12-18 DIAGNOSIS — M542 Cervicalgia: Secondary | ICD-10-CM

## 2016-12-18 DIAGNOSIS — M5030 Other cervical disc degeneration, unspecified cervical region: Secondary | ICD-10-CM

## 2016-12-18 NOTE — Progress Notes (Signed)
 Imaging [phone] called and needed order changed to CT Cervical Spine without contrast.  Changed.

## 2016-12-26 ENCOUNTER — Other Ambulatory Visit: Payer: Medicare Other

## 2016-12-30 ENCOUNTER — Ambulatory Visit: Payer: Medicare Other | Admitting: Nurse Practitioner

## 2017-01-14 ENCOUNTER — Ambulatory Visit: Payer: Medicare Other | Admitting: Internal Medicine

## 2017-01-30 ENCOUNTER — Ambulatory Visit (INDEPENDENT_AMBULATORY_CARE_PROVIDER_SITE_OTHER): Payer: Medicare Other | Admitting: Internal Medicine

## 2017-01-30 ENCOUNTER — Encounter: Payer: Self-pay | Admitting: Internal Medicine

## 2017-01-30 VITALS — BP 110/70 | HR 75 | Temp 98.7°F | Resp 20 | Ht 61.0 in | Wt 115.6 lb

## 2017-01-30 DIAGNOSIS — N3281 Overactive bladder: Secondary | ICD-10-CM | POA: Diagnosis not present

## 2017-01-30 DIAGNOSIS — M8580 Other specified disorders of bone density and structure, unspecified site: Secondary | ICD-10-CM | POA: Diagnosis not present

## 2017-01-30 DIAGNOSIS — I10 Essential (primary) hypertension: Secondary | ICD-10-CM

## 2017-01-30 DIAGNOSIS — M5442 Lumbago with sciatica, left side: Secondary | ICD-10-CM | POA: Diagnosis not present

## 2017-01-30 DIAGNOSIS — I8393 Asymptomatic varicose veins of bilateral lower extremities: Secondary | ICD-10-CM

## 2017-01-30 DIAGNOSIS — K649 Unspecified hemorrhoids: Secondary | ICD-10-CM

## 2017-01-30 DIAGNOSIS — Z79899 Other long term (current) drug therapy: Secondary | ICD-10-CM | POA: Diagnosis not present

## 2017-01-30 DIAGNOSIS — G5603 Carpal tunnel syndrome, bilateral upper limbs: Secondary | ICD-10-CM

## 2017-01-30 DIAGNOSIS — G8929 Other chronic pain: Secondary | ICD-10-CM

## 2017-01-30 DIAGNOSIS — M2550 Pain in unspecified joint: Secondary | ICD-10-CM

## 2017-01-30 DIAGNOSIS — Z23 Encounter for immunization: Secondary | ICD-10-CM

## 2017-01-30 DIAGNOSIS — R413 Other amnesia: Secondary | ICD-10-CM

## 2017-01-30 LAB — COMPLETE METABOLIC PANEL WITH GFR
AG RATIO: 1.8 (calc) (ref 1.0–2.5)
ALT: 15 U/L (ref 6–29)
AST: 26 U/L (ref 10–35)
Albumin: 4.2 g/dL (ref 3.6–5.1)
Alkaline phosphatase (APISO): 47 U/L (ref 33–130)
BILIRUBIN TOTAL: 0.8 mg/dL (ref 0.2–1.2)
BUN / CREAT RATIO: 18 (calc) (ref 6–22)
BUN: 17 mg/dL (ref 7–25)
CALCIUM: 9.2 mg/dL (ref 8.6–10.4)
CO2: 26 mmol/L (ref 20–32)
Chloride: 109 mmol/L (ref 98–110)
Creat: 0.94 mg/dL — ABNORMAL HIGH (ref 0.60–0.93)
GFR, EST AFRICAN AMERICAN: 68 mL/min/{1.73_m2} (ref >=60)
GFR, EST NON AFRICAN AMERICAN: 59 mL/min/{1.73_m2} — AB (ref >=60)
GLOBULIN: 2.4 g/dL (ref 1.9–3.7)
Glucose, Bld: 88 mg/dL (ref 65–139)
POTASSIUM: 4.2 mmol/L (ref 3.5–5.3)
SODIUM: 142 mmol/L (ref 135–146)
TOTAL PROTEIN: 6.6 g/dL (ref 6.1–8.1)

## 2017-01-30 MED ORDER — VITAMIN D 1000 UNITS PO TABS: 1000.0000 [IU] | ORAL_TABLET | Freq: Every day | ORAL | 3 refills | Status: AC

## 2017-01-30 MED ORDER — PREDNISONE 5 MG PO TABS: 5.0000 mg | ORAL_TABLET | Freq: Every day | ORAL | 1 refills | Status: DC

## 2017-01-30 MED ORDER — MELOXICAM 7.5 MG PO TABS: 7.5000 mg | ORAL_TABLET | Freq: Every day | ORAL | 1 refills | Status: DC

## 2017-01-30 NOTE — Patient Instructions (Signed)
Will call with lab and imaging results  Influenza vaccine given today  Will call with referral appts to Surgery for hemorrhoids  START MELOXICAM daily for arthritis. Take with food  Continue other medications as ordered  Follow up with Janett Billow in 2 mos for arthritis, HTN, back pain

## 2017-01-30 NOTE — Progress Notes (Addendum)
Patient ID: Kimberly Mclean, female   DOB: Feb 28, 1941, 76 y.o.   MRN: [id number]    Location:  [facility] Place of Service: OFFICE  Chief Complaint  Patient presents with  . Medical Management of Chronic Issues    6 week f/u    HPI:  76 yo female seen today for f/u. She noted hands turn red and pale x 2 mos. She has hand pain and takes prednisone daily along with gabapentin. At previous PCP office, she was taking meloxicam and prn hydrocodone, per old records. She is a poor historian due to memory loss. Hx obtained from chart. Per old records, she also refuses to follow up with specialists when appts made.  OAB - stable on myrbetriq ER  CTS - b/l hands. She has difficulty gripping objects due to throbbing and numbness. 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 Uses eye gtts in [place]. Followed by Dr ShGershon CraneExternal hemorrhoids - she has had banding for 3rd degree hemorrhoids in past without relief. She states they are very uncomfortable and she would like them "fixed".  Osteopenia - stable. Takes vit D3. Last DXA in April 2018  Past Medical History:  Diagnosis Date  . Anemia   . Anxiety   . [place]  . Arthritis   . Back pain   . Chest pain   . Chronic renal disease, stage II   . Constipation   . Dementia    frontemporal  . Depression   . Fibromyositis   . Glaucoma    right eye  . Gross hematuria   . Hair loss   . Hemorrhoids   . History of pneumothorax    left  . Horseshoe kidney   . Hyperlipidemia   . Hypertension   . Hypertensive renal disease, benign   . [place]  . Multiple rib fractures    Left side: eibs 3 through 6  . Osteopenia   . Osteoporosis   . Personal history of DVT (deep vein thrombosis)   . Problems with hearing   . Renal cyst   . Urinary urgency   . Visual problems   . Vitamin D deficiency     Past Surgical  History:  Procedure Laterality Date  . BREAST EXCISIONAL BIOPSY Left 1993   no visible scar  . BREAST EXCISIONAL BIOPSY Left 1992   no visible scar  . BREAST SURGERY  1970's   removed 2 benign lumps  . LUNG SURGERY     left lung punctured-surgically repaired  . TOTAL ABDOMINAL HYSTERECTOMY      Patient Care Team: EuLauree ChandlerNP as PCP - General (Geriatric Medicine)  Social History   Social History  . Marital status: Single    Spouse name: N/A  . Number of children: N/A  . Years of education: N/A   Occupational History  . Not on file.   Social History Main Topics  . Smoking status: Never Smoker  . Smokeless tobacco: Never Used  . Alcohol use No  . Drug use: No  . Sexual activity: Not Currently   Other Topics Concern  . Not on file   Social History Narrative   Social History      Diet? no      Do you drink/eat things with caffeine? yes      Marital status?  What year were you married?      Do you live in a house, apartment, assisted living, condo, trailer, etc.? house      Is it one or more stories? 1 story      How many persons live in your home? none      Do you have any pets in your home? (please list) no      Highest level of education completed? 12 grade      Current or past profession: [employer]. [employer]      Advanced Directives      Do you exercise?      Some time                                Type & how often? Leg and arm raises 2 x week      Do you have a living will? no      Do you have a DNR form?   no                               If not, do you want to discuss one? no      Do you have signed POA/HPOA for forms?       Functional Status      Do you have difficulty bathing or dressing yourself? no      Do you have difficulty preparing food or eating? no      Do you have difficulty managing your medications? no      Do you have difficulty managing your  finances? no      Do you have difficulty affording your medications? no        reports that she has never smoked. She has never used smokeless tobacco. She reports that she does not drink alcohol or use drugs.  Family History  Problem Relation Age of Onset  . Prostate cancer Father   . Hypertension Mother        brother also  . Emphysema Mother   . Lung cancer Sister   . Colon cancer Neg Hx    Family Status  Relation Status  . Father Deceased       unknown health history  . Mother Deceased       unknown health hx  . Sister Deceased  . Sister Deceased  . Brother Alive  . Brother Deceased  . Brother Deceased  . Brother Alive  . Sister Alive  . Son Deceased  . Neg Hx (Not Specified)     No Known Allergies  Medications: Patient's Medications  New Prescriptions   No medications on file  Previous Medications   ACETAMINOPHEN (TYLENOL) 325 MG TABLET    Take 650 mg by mouth every 6 (six) hours as needed.   AMLODIPINE (NORVASC) 2.5 MG TABLET    Take 1 tablet (2.5 mg total) by mouth daily.   CHOLECALCIFEROL (VITAMIN D) 1000 UNITS TABLET    Take 1,000 Units by mouth daily.   DORZOLAMIDE-TIMOLOL (COSOPT) 22.3-6.8 MG/ML OPHTHALMIC SOLUTION    INSTILL 1 DROP INTO BOTH EYES TWICE DAILY   GABAPENTIN (NEURONTIN) 600 MG TABLET    Take 1 tablet (600 mg total) by mouth at bedtime.   LATANOPROST (XALATAN) 0.005 % OPHTHALMIC SOLUTION    INSTILL 1 DROP INTO BOTH EYES AT BEDTIME   LOSARTAN (COZAAR) 100 MG  TABLET    Take 1 tablet (100 mg total) by mouth daily.   MIRABEGRON ER (MYRBETRIQ) 25 MG TB24 TABLET    Take 1 tablet (25 mg total) by mouth daily.   PREDNISONE (DELTASONE) 5 MG TABLET    Take 1 tablet (5 mg total) by mouth daily.  Modified Medications   No medications on file  Discontinued Medications   No medications on file    Review of Systems  Unable to perform ROS: Other (memory loss)    Vitals:   01/30/17 1128  BP: 110/70  Pulse: 75  Resp: 20  Temp: 98.7 F (37.1 C)    TempSrc: Oral  SpO2: 98%  Weight: 115 lb 9.6 oz (52.4 kg)  Height: '5\' 1"'  (1.549 m)   Body mass index is 21.84 kg/m.  Physical Exam  Constitutional: She appears well-developed.  Frail appearing in NAD  HENT:  Mouth/Throat: Oropharynx is clear and moist. No oropharyngeal exudate.  Poor dentition; MMM; no oral thrush  Eyes: Pupils are equal, round, and reactive to light. No scleral icterus.  Neck: Neck supple. Muscular tenderness present. No spinous process tenderness present. Carotid bruit is not present. Decreased range of motion present. No tracheal deviation present. No thyromegaly present.  sidebending contracture noted  Cardiovascular: Normal rate, regular rhythm and intact distal pulses.  Exam reveals no gallop and no friction rub.   Murmur (1/6 SEM) heard. No LE edema b/l. no calf TTP. B/l NT, soft LE varicose veins  Pulmonary/Chest: Effort normal and breath sounds normal. No stridor. No respiratory distress. She has no wheezes. She has no rales.  Abdominal: Soft. Normal appearance and bowel sounds are normal. She exhibits no distension and no mass. There is no hepatomegaly. There is no tenderness. There is no rigidity, no rebound and no guarding. No hernia.  Genitourinary: Rectal exam shows external hemorrhoid (thrombosed). Rectal exam shows no fissure.  Musculoskeletal: She exhibits edema, tenderness and deformity (neck contracture).       Lumbar back: She exhibits decreased range of motion, tenderness and spasm. She exhibits no bony tenderness.       Back:  (+) Tinel's b/l; multiple small and large joint deformities; Neg SLR  Lymphadenopathy:    She has no cervical adenopathy.  Neurological: She is alert.  Skin: Skin is warm and dry. No rash noted.  Psychiatric: She has a normal mood and affect. Thought content normal. Her speech is tangential. She is agitated.     Labs reviewed: Abstract on 11/07/2016  Component Date Value Ref Range Status  . Triglycerides 11/17/2014  88  40 - 160 Final  . Cholesterol 11/17/2014 222* 0 - 200 Final  . HDL 11/17/2014 93* 35 - 70 Final  . LDL Cholesterol 11/17/2014 111   Final  . Hemoglobin A1C 11/17/2014 5.3   Final  . TSH 11/17/2014 0.67  0.41 - 5.90 Final  . Hemoglobin 11/17/2014 13.0  12.0 - 16.0 Final  . HCT 11/17/2014 41  36 - 46 Final  . Platelets 11/17/2014 279  150 - 399 Final  . WBC 11/17/2014 4.1   Final  . Glucose 11/17/2014 57   Final  . BUN 11/17/2014 19  4 - 21 Final  . Creatinine 11/17/2014 0.9  0.5 - 1.1 Final  . Potassium 11/17/2014 4.3  3.4 - 5.3 Final  . Sodium 11/17/2014 145  137 - 147 Final  . Alkaline Phosphatase 11/17/2014 67  25 - 125 Final  . ALT 11/17/2014 23  7 - 35 Final  .  AST 11/17/2014 27  13 - 35 Final  . Bilirubin, Total 11/17/2014 0.8   Final    No results found.   Assessment/Plan   ICD-10-CM   1. Pain in joint involving multiple sites M25.50 meloxicam (MOBIC) 7.5 MG tablet  2. Bilateral carpal tunnel syndrome G56.03 predniSONE (DELTASONE) 5 MG tablet  3. Chronic bilateral low back pain with left-sided sciatica M54.42 DG Lumbar Spine Complete   G89.29   4. 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 Essential hypertension I10   6. Overactive bladder N32.81   7. Hemorrhoids, unspecified hemorrhoid type K64.9 Referral to general surgery   8. Osteopenia, unspecified location M85.80 cholecalciferol (VITAMIN D) 1000 units tablet  9. Memory loss R41.3   10. High risk medication use Z79.899 CMP with eGFR   Will call with lab and imaging results  Influenza vaccine given today  Will call with referral appts to Surgery for hemorrhoids  START MELOXICAM daily for arthritis. Take with food  Continue other medications as ordered  Follow up with Janett Billow in 2 mos for arthritis, HTN, back pain   Alysa Duca S. Perlie Gold  [facility] [address] [place], [place] [address] [phone] Cell (Monday-Friday 8 AM - 5  PM) [phone] After 5 PM and follow prompts

## 2017-01-30 NOTE — Addendum Note (Signed)
Addended by: Gildardo Cranker on: 01/30/2017 01:33 PM   Modules accepted: Orders

## 2017-02-02 ENCOUNTER — Encounter: Payer: Self-pay | Admitting: *Deleted

## 2017-03-11 ENCOUNTER — Ambulatory Visit: Payer: Self-pay | Admitting: Nurse Practitioner

## 2017-04-02 ENCOUNTER — Encounter: Payer: Self-pay | Admitting: Nurse Practitioner

## 2017-04-02 ENCOUNTER — Ambulatory Visit (INDEPENDENT_AMBULATORY_CARE_PROVIDER_SITE_OTHER): Payer: Medicare Other | Admitting: Nurse Practitioner

## 2017-04-02 VITALS — BP 136/86 | HR 85 | Temp 98.5°F | Resp 17 | Ht 61.0 in | Wt 114.0 lb

## 2017-04-02 DIAGNOSIS — N3281 Overactive bladder: Secondary | ICD-10-CM

## 2017-04-02 DIAGNOSIS — G8929 Other chronic pain: Secondary | ICD-10-CM | POA: Diagnosis not present

## 2017-04-02 DIAGNOSIS — K649 Unspecified hemorrhoids: Secondary | ICD-10-CM | POA: Diagnosis not present

## 2017-04-02 DIAGNOSIS — M5030 Other cervical disc degeneration, unspecified cervical region: Secondary | ICD-10-CM

## 2017-04-02 DIAGNOSIS — G5603 Carpal tunnel syndrome, bilateral upper limbs: Secondary | ICD-10-CM

## 2017-04-02 DIAGNOSIS — R413 Other amnesia: Secondary | ICD-10-CM | POA: Diagnosis not present

## 2017-04-02 DIAGNOSIS — M5442 Lumbago with sciatica, left side: Secondary | ICD-10-CM

## 2017-04-02 DIAGNOSIS — I10 Essential (primary) hypertension: Secondary | ICD-10-CM

## 2017-04-02 MED ORDER — PREGABALIN 50 MG PO CAPS: 50.0000 mg | ORAL_CAPSULE | Freq: Three times a day (TID) | ORAL | 1 refills | Status: DC

## 2017-04-02 MED ORDER — PREGABALIN 50 MG PO CAPS: 50.0000 mg | ORAL_CAPSULE | Freq: Two times a day (BID) | ORAL | 1 refills | Status: DC

## 2017-04-02 NOTE — Patient Instructions (Addendum)
We will schedule a CT scan of your neck- to get xray of your lumbar spine when you go to [facility].   To STOP gabapentin  Start lyrica 50 mg 1 tablet  twice daily for pain. -samples given at this time, you can get prescription filled at the pharmacy once samples are complete

## 2017-04-02 NOTE — Progress Notes (Signed)
Careteam: Patient Care Team: Lauree Chandler, NP as PCP - General (Geriatric Medicine)  Advanced Directive information Does Patient Have a Medical Advance Directive?: No  No Known Allergies  Chief Complaint  Patient presents with  . Acute Visit    Pt is being seen due ongoing bilateral hand pain for several months. Pt also reports ongoing pain in left thigh due to injury many years ago.      HPI: Patient is a 76 y.o. female seen in the office today due to pain in hands.  Does not drive- here today alone with someone from [employer] that transports her.  This has been an ongoing issue. She is taking prednisone and gabapentin due to this. Saw Dr Eulas Post and was recommended to take mobic but she did not wish to take this.  Reports gabapentin 600 mg qhs which has not been effective.   Also having ongoing back pain did not get xray.  Having pain in her neck and lower back.  Xray done in august which revealed: Prominent degenerative changes C3-4 thru C7-T1. Limited for evaluating for possibility of fracture. If this were of concern, CT imaging may then be considered. CT was ordered but she did not get  Not taking myrbetriq due to her hx of blood in urine- feels like this contributed- not seeing any more blood in her urine.   Went to [place] surgery due to hemorrhoids recommended fiber which has helped.   HTN- taking norvasc daily   Review of Systems:  Review of Systems  Constitutional: Negative for chills, fever and weight loss.  HENT: Negative for tinnitus.   Respiratory: Negative for cough, sputum production and shortness of breath.   Cardiovascular: Negative for chest pain, palpitations and leg swelling.  Genitourinary: Negative for hematuria.  Musculoskeletal: Positive for myalgias and neck pain. Negative for back pain, falls and joint pain.  Skin: Negative.   Neurological: Positive for tingling and sensory change. Negative for dizziness and headaches.    Psychiatric/Behavioral: Positive for memory loss. Negative for depression. The patient does not have insomnia.     Past Medical History:  Diagnosis Date  . Anemia   . Anxiety   . Aortic atherosclerosis ([place])   . Arthritis   . Back pain   . Chest pain   . Chronic renal disease, stage II   . Constipation   . Dementia    frontemporal  . Depression   . Fibromyositis   . Glaucoma    right eye  . Gross hematuria   . Hair loss   . Hemorrhoids   . History of pneumothorax    left  . Horseshoe kidney   . Hyperlipidemia   . Hypertension   . Hypertensive renal disease, benign   . Malignant neoplasm of colon ([place])   . Multiple rib fractures    Left side: eibs 3 through 6  . Osteopenia   . Osteoporosis   . Personal history of DVT (deep vein thrombosis)   . Problems with hearing   . Renal cyst   . Urinary urgency   . Visual problems   . Vitamin D deficiency    Past Surgical History:  Procedure Laterality Date  . BREAST EXCISIONAL BIOPSY Left 1993   no visible scar  . BREAST EXCISIONAL BIOPSY Left 1992   no visible scar  . BREAST SURGERY  1970's   removed 2 benign lumps  . LUNG SURGERY     left lung punctured-surgically repaired  . TOTAL  ABDOMINAL HYSTERECTOMY     Social History:   reports that  has never smoked. she has never used smokeless tobacco. She reports that she does not drink alcohol or use drugs.  Family History  Problem Relation Age of Onset  . Prostate cancer Father   . Hypertension Mother        brother also  . Emphysema Mother   . Lung cancer Sister   . Colon cancer Neg Hx     Medications:   Medication List        Accurate as of 04/02/17 10:00 AM. Always use your most recent med list.          acetaminophen 325 MG tablet Commonly known as:  TYLENOL   amLODipine 2.5 MG tablet Commonly known as:  NORVASC Take 1 tablet (2.5 mg total) by mouth daily.   cholecalciferol 1000 units tablet Commonly known as:  VITAMIN D Take 1 tablet (1,000  Units total) by mouth daily.   dorzolamide-timolol 22.3-6.8 MG/ML ophthalmic solution Commonly known as:  COSOPT   gabapentin 600 MG tablet Commonly known as:  NEURONTIN Take 1 tablet (600 mg total) by mouth at bedtime.   latanoprost 0.005 % ophthalmic solution Commonly known as:  XALATAN   losartan 100 MG tablet Commonly known as:  COZAAR Take 1 tablet (100 mg total) by mouth daily.   meloxicam 7.5 MG tablet Commonly known as:  MOBIC Take 1 tablet (7.5 mg total) by mouth daily.   mirabegron ER 25 MG Tb24 tablet Commonly known as:  MYRBETRIQ Take 1 tablet (25 mg total) by mouth daily.   predniSONE 5 MG tablet Commonly known as:  DELTASONE Take 1 tablet (5 mg total) by mouth daily.        Physical Exam:  Vitals:   04/02/17 0956  BP: 136/86  Pulse: 85  Resp: 17  Temp: 98.5 F (36.9 C)  TempSrc: Oral  SpO2: 98%  Weight: 114 lb (51.7 kg)  Height: 5\' 1"  (1.549 m)   Body mass index is 21.54 kg/m.  Physical Exam  Constitutional: She appears well-developed and well-nourished. No distress.  HENT:  Head: Normocephalic and atraumatic.  Right Ear: External ear normal.  Left Ear: External ear normal.  Nose: Nose normal.  Mouth/Throat: No oropharyngeal exudate.  Eyes: Conjunctivae are normal. Pupils are equal, round, and reactive to light.  Neck: Neck supple.  Cardiovascular: Normal rate, regular rhythm, normal heart sounds and intact distal pulses.  Pulmonary/Chest: Effort normal and breath sounds normal.  Abdominal: Soft. Bowel sounds are normal. She exhibits no distension and no mass. There is no tenderness. There is no rebound and no guarding.  Musculoskeletal: Normal range of motion. She exhibits tenderness. She exhibits no edema.       Cervical back: She exhibits tenderness. She exhibits normal range of motion.  Kyphosis Grip strength normal bilaterally Decrease sensation to monofilament on left hand/fingertips   Neurological: She is alert.  Skin: Skin is  warm and dry. She is not diaphoretic.  Psychiatric: Her speech is rapid and/or pressured. She expresses impulsivity.    Labs reviewed: Basic Metabolic Panel: Recent Labs    07/24/16 0857 09/01/16 1151 10/03/16 1326 01/30/17 1248  NA 141  --  138 142  K 4.2  --  3.8 4.2  CL 107  --  107 109  CO2 26  --  23 26  GLUCOSE 81  --  136* 88  BUN 15  --  12 17  CREATININE 0.85  --  0.99 0.94*  CALCIUM 9.2  --  9.2 9.2  TSH  --  0.67  --   --    Liver Function Tests: Recent Labs    07/24/16 0857 01/30/17 1248  AST 17 26  ALT 14 15  ALKPHOS 47  --   BILITOT 0.7 0.8  PROT 6.6 6.6  ALBUMIN 4.2  --    No results for input(s): LIPASE, AMYLASE in the last 8760 hours. No results for input(s): AMMONIA in the last 8760 hours. CBC: Recent Labs    07/24/16 0857 10/03/16 1326 10/06/16 1457  WBC 4.6 5.0 5.1  NEUTROABS 2,438  --  3,366  HGB 13.1 12.8 13.2  HCT 41.2 40.2 41.2  MCV 88.2 87.0 87.7  PLT 279 232 269   Lipid Panel: Recent Labs    07/24/16 0857  CHOL 255*  HDL 94  LDLCALC 144*  TRIG 86  CHOLHDL 2.7   TSH: Recent Labs    09/01/16 1151  TSH 0.67   A1C: Lab Results  Component Value Date   HGBA1C 5.3 11/17/2014     Assessment/Plan 1. Chronic bilateral low back pain with left-sided sciatica Lumbar spine imaging ordered by Dr Eulas Post at previous visit but she has not gotten at this time. Encouraged to get [facility].  Did not wish to start mobic   2. Essential hypertension -stable on current regimen  3. Overactive bladder Has STOP myrbetriq due to blood in urine, not currently having compliant  4. Hemorrhoids, unspecified hemorrhoid type Improved on fiber.   5. Bilateral carpal tunnel syndrome -increase pain. Will get CT for further evaluation of this pain. -to STOP gabapentin at this time- reports it is not effective.  -will start lyrica twice daily  - CT CERVICAL SPINE WO CONTRAST; Future - pregabalin (LYRICA) 50 MG capsule; Take 1 capsule  (50 mg total) by mouth 2 (two) times daily.  Dispense: 60 capsule; Refill: 1  6. DDD (degenerative disc disease), cervical -ongoing neck pain, will reorder CT scan - CT CERVICAL SPINE WO CONTRAST; Future  7. Memory loss Again here today alone, encouraged her to come with family  Next appt: 2 months.  Carlos American. Harle Battiest  [facility] [phone] 8 am - 5 pm) [phone] (after hours)

## 2017-04-03 ENCOUNTER — Telehealth: Payer: Self-pay

## 2017-04-03 NOTE — Telephone Encounter (Signed)
Patient called to inquire about why she was prescribed Lyrica.  I explained to patient rx was prescribed as treatment for hand pain mentioned at last ov  Response was to patients apparent satisfaction

## 2017-05-14 ENCOUNTER — Ambulatory Visit
Admission: RE | Admit: 2017-05-14 | Discharge: 2017-05-14 | Disposition: A | Discharge status: Home / Self Care | Payer: Medicare Other | Source: Ambulatory Visit | Attending: Nurse Practitioner | Admitting: Nurse Practitioner

## 2017-05-14 DIAGNOSIS — M5030 Other cervical disc degeneration, unspecified cervical region: Secondary | ICD-10-CM

## 2017-05-14 DIAGNOSIS — G5603 Carpal tunnel syndrome, bilateral upper limbs: Secondary | ICD-10-CM

## 2017-05-15 ENCOUNTER — Other Ambulatory Visit: Payer: Self-pay | Admitting: Nurse Practitioner

## 2017-05-15 DIAGNOSIS — M4802 Spinal stenosis, cervical region: Secondary | ICD-10-CM

## 2017-05-27 ENCOUNTER — Telehealth: Payer: Self-pay

## 2017-05-27 ENCOUNTER — Other Ambulatory Visit: Payer: Self-pay

## 2017-05-27 DIAGNOSIS — G5603 Carpal tunnel syndrome, bilateral upper limbs: Secondary | ICD-10-CM

## 2017-05-27 MED ORDER — PREGABALIN 50 MG PO CAPS: 50.0000 mg | ORAL_CAPSULE | Freq: Two times a day (BID) | ORAL | 1 refills | Status: DC

## 2017-05-27 NOTE — Telephone Encounter (Signed)
Patient requested a refill of lyrica 50 mg tablets. She states that she has been out of this medication for a week.

## 2017-05-27 NOTE — Telephone Encounter (Signed)
Patient called to say that she got everything straightened out with [facility]. She stated that she would call if she had any concerns in the future.

## 2017-05-27 NOTE — Telephone Encounter (Signed)
I returned patient's call regarding needing an appointment. Patient stated that she went to [facility] and was told that there was no order for her to have a scan done. I called [facility] and was told that they had attempted to contact patient to schedule MRI but they did not reach patient.   I attempted to call patient several times to let her know to expect a call from [facility] but I could not get anyone to answer the phone. Will attempt again later.

## 2017-06-01 ENCOUNTER — Telehealth: Payer: Self-pay

## 2017-06-01 NOTE — Telephone Encounter (Signed)
Patient called to due to concerns with the Lyrica prescription she received from her mail order service. Patient stated that she was unable to pull the capsule apart to take the powdered medicine inside.   I advised patient to take the capsule whole and not to separate capsule. Pt then stated that she had pulled apart the samples that she was given from the office and only took the powder inside.   Again patient was advised to take entire capsule. Pt verbalized understanding.

## 2017-06-08 NOTE — Telephone Encounter (Signed)
Error..cdavis °

## 2017-06-24 ENCOUNTER — Ambulatory Visit
Admission: RE | Admit: 2017-06-24 | Discharge: 2017-06-24 | Disposition: A | Discharge status: Home / Self Care | Payer: Medicare Other | Source: Ambulatory Visit | Attending: Nurse Practitioner | Admitting: Nurse Practitioner

## 2017-06-24 ENCOUNTER — Other Ambulatory Visit: Payer: Self-pay | Admitting: Nurse Practitioner

## 2017-06-24 DIAGNOSIS — M9981 Other biomechanical lesions of cervical region: Principal | ICD-10-CM

## 2017-06-24 DIAGNOSIS — M4802 Spinal stenosis, cervical region: Secondary | ICD-10-CM

## 2017-07-16 ENCOUNTER — Telehealth: Payer: Self-pay | Admitting: Nurse Practitioner

## 2017-07-16 NOTE — Telephone Encounter (Signed)
I called the patient to schedule AWV/CPE.  She asked that I call her back because she's busy. VDM (DD)

## 2017-07-30 ENCOUNTER — Ambulatory Visit: Payer: Medicare Other | Admitting: Physical Therapy

## 2017-09-09 ENCOUNTER — Ambulatory Visit (INDEPENDENT_AMBULATORY_CARE_PROVIDER_SITE_OTHER): Payer: Medicare Other

## 2017-09-09 ENCOUNTER — Encounter: Payer: Medicare Other | Admitting: Nurse Practitioner

## 2017-09-09 ENCOUNTER — Encounter: Payer: Self-pay | Admitting: Nurse Practitioner

## 2017-09-09 VITALS — BP 138/80 | HR 69 | Temp 98.3°F | Ht 61.0 in | Wt 113.0 lb

## 2017-09-09 DIAGNOSIS — Z0000 Encounter for general adult medical examination without abnormal findings: Secondary | ICD-10-CM

## 2017-09-09 NOTE — Patient Instructions (Signed)
Kimberly Mclean , Thank you for taking time to come for your Medicare Wellness Visit. I appreciate your ongoing commitment to your health goals. Please review the following plan we discussed and let me know if I can assist you in the future.   Screening recommendations/referrals: Colonoscopy up to date, due 05/13/2021 Mammogram up to date, due 08/30/2018 Bone Density up to date Recommended yearly ophthalmology/optometry visit for glaucoma screening and checkup Recommended yearly dental visit for hygiene and checkup  Vaccinations: Influenza vaccine up to date, due 2019 fall season Pneumococcal vaccine due, declined Tdap vaccine up to date, due 02/23/2023 Shingles vaccine due, declined    Advanced directives: Please bring [place] a copy of your living will and health care power of attorney  Conditions/risks identified: none  Next appointment: Tyson Dense, RN 09/13/2018 @ 10:45pm   Preventive Care 65 Years and Older, Female Preventive care refers to lifestyle choices and visits with your health care provider that can promote health and wellness. What does preventive care include?  A yearly physical exam. This is also called an annual well check.  Dental exams once or twice a year.  Routine eye exams. Ask your health care provider how often you should have your eyes checked.  Personal lifestyle choices, including:  Daily care of your teeth and gums.  Regular physical activity.  Eating a healthy diet.  Avoiding tobacco and drug use.  Limiting alcohol use.  Practicing safe sex.  Taking low-dose aspirin every day.  Taking vitamin and mineral supplements as recommended by your health care provider. What happens during an annual well check? The services and screenings done by your health care provider during your annual well check will depend on your age, overall health, lifestyle risk factors, and family history of disease. Counseling  Your health care provider may ask you questions  about your:  Alcohol use.  Tobacco use.  Drug use.  Emotional well-being.  Home and relationship well-being.  Sexual activity.  Eating habits.  History of falls.  Memory and ability to understand (cognition).  Work and work Statistician.  Reproductive health. Screening  You may have the following tests or measurements:  Height, weight, and BMI.  Blood pressure.  Lipid and cholesterol levels. These may be checked every 5 years, or more frequently if you are over 39 years old.  Skin check.  Lung cancer screening. You may have this screening every year starting at age 36 if you have a 30-pack-year history of smoking and currently smoke or have quit within the past 15 years.  Fecal occult blood test (FOBT) of the stool. You may have this test every year starting at age 19.  Flexible sigmoidoscopy or colonoscopy. You may have a sigmoidoscopy every 5 years or a colonoscopy every 10 years starting at age 92.  Hepatitis C blood test.  Hepatitis B blood test.  Sexually transmitted disease (STD) testing.  Diabetes screening. This is done by checking your blood sugar (glucose) after you have not eaten for a while (fasting). You may have this done every 1-3 years.  Bone density scan. This is done to screen for osteoporosis. You may have this done starting at age 69.  Mammogram. This may be done every 1-2 years. Talk to your health care provider about how often you should have regular mammograms. Talk with your health care provider about your test results, treatment options, and if necessary, the need for more tests. Vaccines  Your health care provider may recommend certain vaccines, such as:  Influenza  vaccine. This is recommended every year.  Tetanus, diphtheria, and acellular pertussis (Tdap, Td) vaccine. You may need a Td booster every 10 years.  Zoster vaccine. You may need this after age 64.  Pneumococcal 13-valent conjugate (PCV13) vaccine. One dose is  recommended after age 80.  Pneumococcal polysaccharide (PPSV23) vaccine. One dose is recommended after age 63. Talk to your health care provider about which screenings and vaccines you need and how often you need them. This information is not intended to replace advice given to you by your health care provider. Make sure you discuss any questions you have with your health care provider. Document Released: 05/18/2015 Document Revised: 01/09/2016 Document Reviewed: 02/20/2015 Elsevier Interactive Patient Education  2017 [place] Prevention in the Home Falls can cause injuries. They can happen to people of all ages. There are many things you can do to make your home safe and to help prevent falls. What can I do on the outside of my home?  Regularly fix the edges of walkways and driveways and fix any cracks.  Remove anything that might make you trip as you walk through a door, such as a raised step or threshold.  Trim any bushes or trees on the path to your home.  Use bright outdoor lighting.  Clear any walking paths of anything that might make someone trip, such as rocks or tools.  Regularly check to see if handrails are loose or broken. Make sure that both sides of any steps have handrails.  Any raised decks and porches should have guardrails on the edges.  Have any leaves, snow, or ice cleared regularly.  Use sand or salt on walking paths during winter.  Clean up any spills in your garage right away. This includes oil or grease spills. What can I do in the bathroom?  Use night lights.  Install grab bars by the toilet and in the tub and shower. Do not use towel bars as grab bars.  Use non-skid mats or decals in the tub or shower.  If you need to sit down in the shower, use a plastic, non-slip stool.  Keep the floor dry. Clean up any water that spills on the floor as soon as it happens.  Remove soap buildup in the tub or shower regularly.  Attach bath mats  securely with double-sided non-slip rug tape.  Do not have throw rugs and other things on the floor that can make you trip. What can I do in the bedroom?  Use night lights.  Make sure that you have a light by your bed that is easy to reach.  Do not use any sheets or blankets that are too big for your bed. They should not hang down onto the floor.  Have a firm chair that has side arms. You can use this for support while you get dressed.  Do not have throw rugs and other things on the floor that can make you trip. What can I do in the kitchen?  Clean up any spills right away.  Avoid walking on wet floors.  Keep items that you use a lot in easy-to-reach places.  If you need to reach something above you, use a strong step stool that has a grab bar.  Keep electrical cords out of the way.  Do not use floor polish or wax that makes floors slippery. If you must use wax, use non-skid floor wax.  Do not have throw rugs and other things on the floor that can  make you trip. What can I do with my stairs?  Do not leave any items on the stairs.  Make sure that there are handrails on both sides of the stairs and use them. Fix handrails that are broken or loose. Make sure that handrails are as long as the stairways.  Check any carpeting to make sure that it is firmly attached to the stairs. Fix any carpet that is loose or worn.  Avoid having throw rugs at the top or bottom of the stairs. If you do have throw rugs, attach them to the floor with carpet tape.  Make sure that you have a light switch at the top of the stairs and the bottom of the stairs. If you do not have them, ask someone to add them for you. What else can I do to help prevent falls?  Wear shoes that:  Do not have high heels.  Have rubber bottoms.  Are comfortable and fit you well.  Are closed at the toe. Do not wear sandals.  If you use a stepladder:  Make sure that it is fully opened. Do not climb a closed  stepladder.  Make sure that both sides of the stepladder are locked into place.  Ask someone to hold it for you, if possible.  Clearly mark and make sure that you can see:  Any grab bars or handrails.  First and last steps.  Where the edge of each step is.  Use tools that help you move around (mobility aids) if they are needed. These include:  Canes.  Walkers.  Scooters.  Crutches.  Turn on the lights when you go into a dark area. Replace any light bulbs as soon as they burn out.  Set up your furniture so you have a clear path. Avoid moving your furniture around.  If any of your floors are uneven, fix them.  If there are any pets around you, be aware of where they are.  Review your medicines with your doctor. Some medicines can make you feel dizzy. This can increase your chance of falling. Ask your doctor what other things that you can do to help prevent falls. This information is not intended to replace advice given to you by your health care provider. Make sure you discuss any questions you have with your health care provider. Document Released: 02/15/2009 Document Revised: 09/27/2015 Document Reviewed: 05/26/2014 Elsevier Interactive Patient Education  2017 [employer].

## 2017-09-09 NOTE — Progress Notes (Addendum)
Subjective:   Kimberly Mclean is a 77 y.o. female who presents for Medicare Annual (Subsequent) preventive examination.  Last AWV-08/05/2016    Objective:     Vitals: BP 138/80 (BP Location: Left Arm, Patient Position: Sitting)   Pulse 69   Temp 98.3 F (36.8 C) (Oral)   Ht 5\' 1"  (1.549 m)   Wt 113 lb (51.3 kg)   SpO2 97%   BMI 21.35 kg/m   Body mass index is 21.35 kg/m.  Advanced Directives 09/09/2017 04/02/2017 01/30/2017 12/08/2016 10/06/2016 09/01/2016 08/05/2016  Does Patient Have a Medical Advance Directive? Yes No No No No No No  Type of Advance Directive Living will;Healthcare Power of Attorney - - - - - -  Does patient want to make changes to medical advance directive? No - Patient declined - - - - - -  Copy of [place] in Chart? No - copy requested - - - - - -  Would patient like information on creating a medical advance directive? - - Yes (ED - Information included in AVS) - - - No - Patient declined    Tobacco Social History   Tobacco Use  Smoking Status Never Smoker  Smokeless Tobacco Never Used     Counseling given: Not Answered   Clinical Intake:     Pain : 0-10 Pain Score: 7  Pain Type: Chronic pain Pain Location: Back Pain Orientation: Lower Pain Descriptors / Indicators: Aching Pain Onset: More than a month ago Pain Frequency: Intermittent     Nutritional Risks: None Diabetes: No  What is the last grade level you completed in school?: HS  Interpreter Needed?: No  Information entered by :: Tyson Dense, RN  Past Medical History:  Diagnosis Date  . Anemia   . Anxiety   . Aortic atherosclerosis ([place])   . Arthritis   . Back pain   . Chest pain   . Chronic renal disease, stage II   . Constipation   . Dementia    frontemporal  . Depression   . Fibromyositis   . Glaucoma    right eye  . Gross hematuria   . Hair loss   . Hemorrhoids   . History of pneumothorax    left  . Horseshoe kidney   . Hyperlipidemia   .  Hypertension   . Hypertensive renal disease, benign   . Malignant neoplasm of colon ([place])   . Multiple rib fractures    Left side: eibs 3 through 6  . Osteopenia   . Osteoporosis   . Personal history of DVT (deep vein thrombosis)   . Problems with hearing   . Renal cyst   . Urinary urgency   . Visual problems   . Vitamin D deficiency    Past Surgical History:  Procedure Laterality Date  . BREAST EXCISIONAL BIOPSY Left 1993   no visible scar  . BREAST EXCISIONAL BIOPSY Left 1992   no visible scar  . BREAST SURGERY  1970's   removed 2 benign lumps  . LUNG SURGERY     left lung punctured-surgically repaired  . TOTAL ABDOMINAL HYSTERECTOMY     Family History  Problem Relation Age of Onset  . Prostate cancer Father   . Hypertension Mother        brother also  . Emphysema Mother   . Lung cancer Sister   . Colon cancer Neg Hx    Social History   Socioeconomic History  . Marital status: Single  Spouse name: Not on file  . Number of children: Not on file  . Years of education: Not on file  . Highest education level: Not on file  Occupational History  . Not on file  Social Needs  . Financial resource strain: Not hard at all  . Food insecurity:    Worry: Never true    Inability: Never true  . Transportation needs:    Medical: No    Non-medical: No  Tobacco Use  . Smoking status: Never Smoker  . Smokeless tobacco: Never Used  Substance and Sexual Activity  . Alcohol use: No  . Drug use: No  . Sexual activity: Not Currently  Lifestyle  . Physical activity:    Days per week: 0 days    Minutes per session: 0 min  . Stress: Rather much  Relationships  . Social connections:    Talks on phone: Once a week    Gets together: Once a week    Attends religious service: More than 4 times per year    Active member of club or organization: No    Attends meetings of clubs or organizations: Never    Relationship status: Never married  Other Topics Concern  . Not on  file  Social History Narrative   Social History      Diet? no      Do you drink/eat things with caffeine? yes      Marital status?                                    What year were you married?      Do you live in a house, apartment, assisted living, condo, trailer, etc.? house      Is it one or more stories? 1 story      How many persons live in your home? none      Do you have any pets in your home? (please list) no      Highest level of education completed? 12 grade      Current or past profession: [employer]. [employer]      Advanced Directives      Do you exercise?      Some time                                Type & how often? Leg and arm raises 2 x week      Do you have a living will? no      Do you have a DNR form?   no                               If not, do you want to discuss one? no      Do you have signed POA/HPOA for forms?       Functional Status      Do you have difficulty bathing or dressing yourself? no      Do you have difficulty preparing food or eating? no      Do you have difficulty managing your medications? no      Do you have difficulty managing your finances? no      Do you have difficulty affording your medications? no    Outpatient Encounter Medications as of 09/09/2017  Medication Sig  . acetaminophen (TYLENOL) 325 MG tablet Take 650 mg by mouth every 6 (six) hours as needed.  Marland Kitchen amLODipine (NORVASC) 2.5 MG tablet Take 1 tablet (2.5 mg total) by mouth daily.  . cholecalciferol (VITAMIN D) 1000 units tablet Take 1 tablet (1,000 Units total) by mouth daily.  . dorzolamide-timolol (COSOPT) 22.3-6.8 MG/ML ophthalmic solution INSTILL 1 DROP INTO BOTH EYES TWICE DAILY  . latanoprost (XALATAN) 0.005 % ophthalmic solution INSTILL 1 DROP INTO BOTH EYES AT BEDTIME  . losartan (COZAAR) 100 MG tablet Take 1 tablet (100 mg total) by mouth daily.  . predniSONE (DELTASONE) 5 MG tablet Take 1 tablet (5 mg total) by mouth  daily.  . pregabalin (LYRICA) 50 MG capsule Take 1 capsule (50 mg total) by mouth 2 (two) times daily.  . [DISCONTINUED] gabapentin (NEURONTIN) 600 MG tablet Take 1 tablet (600 mg total) by mouth at bedtime.   No facility-administered encounter medications on file as of 09/09/2017.     Activities of Daily Living In your present state of health, do you have any difficulty performing the following activities: 09/09/2017  Hearing? Y  Vision? N  Difficulty concentrating or making decisions? Y  Walking or climbing stairs? Y  Dressing or bathing? Y  Doing errands, shopping? Y  Preparing Food and eating ? Y  Using the Toilet? Y  In the past six months, have you accidently leaked urine? Y  Do you have problems with loss of bowel control? N  Managing your Medications? Y  Managing your Finances? Y  Housekeeping or managing your Housekeeping? Y  Some recent data might be hidden    Patient Care Team: Lauree Chandler, NP as PCP - General (Geriatric Medicine)    Assessment:   This is a routine wellness examination for Ryleah.  Exercise Activities and Dietary recommendations Current Exercise Habits: The patient does not participate in regular exercise at present, Exercise limited by: None identified  Goals    . Exercise 3x per week (30 min per time)     Starting 08/05/2016 I will become a member of [employer] sne[employer] and start going to the gym.       Fall Risk Fall Risk  09/09/2017 04/02/2017 12/08/2016 09/01/2016 08/05/2016  Falls in the past year? No No No No No   Is the patient's home free of loose throw rugs in walkways, pet beds, electrical cords, etc?   yes      Grab bars in the bathroom? no      Handrails on the stairs?   yes      Adequate lighting?   yes  Depression Screen PHQ 2/9 Scores 09/09/2017 08/05/2016 06/30/2016  PHQ - 2 Score 6 1 3   PHQ- 9 Score 11 - 15     Cognitive Function MMSE - Mini Mental State Exam 09/09/2017 08/05/2016 06/30/2016  Not completed: Refused (No Data) -    Orientation to time 3 - 3  Orientation to Place 4 - 4  Registration 3 - 3  Attention/ Calculation 0 - 0  Recall 0 - 0  Language- name 2 objects 2 - 2  Language- repeat 0 - 0  Language- follow 3 step command 3 - 2  Language- read & follow direction 1 - 1  Write a sentence 1 - 1  Copy design 0 - 0  Total score 17 - 16        Immunization History  Administered Date(s) Administered  . Influenza,inj,Quad PF,6+ Mos 01/30/2017    Qualifies for  Shingles Vaccine? Yes, edcuated and declined  Screening Tests Health Maintenance  Topic Date Due  . PNA vac Low Risk Adult (1 of 2 - PCV13) 06/24/2018 (Originally 06/04/2005)  . TETANUS/TDAP  10/03/2018 (Originally 06/05/1959)  . INFLUENZA VACCINE  12/03/2017  . DEXA SCAN  Completed    Cancer Screenings: Lung: Low Dose CT Chest recommended if Age 58-80 years, 30 pack-year currently smoking OR have quit w/in 15years. Patient does not qualify Breast:  Up to date on Mammogram? Yes Up to date of Bone Density/Dexa? Yes Colorectal: up to date  Additional Screenings: Hepatitis C Screening: declined PNA 13 due and declined    Plan:    I have personally reviewed and addressed the Medicare Annual Wellness questionnaire and have noted the following in the patient's chart:  A. Medical and social history B. Use of alcohol, tobacco or illicit drugs  C. Current medications and supplements D. Functional ability and status E.  Nutritional status F.  Physical activity G. Advance directives H. List of other physicians I.  Hospitalizations, surgeries, and ER visits in previous 12 months J.  [place] to include hearing, vision, cognitive, depression L. Referrals and appointments - C3-dentist  In addition, I have reviewed and discussed with patient certain preventive protocols, quality metrics, and best practice recommendations. A written personalized care plan for preventive services as well as general preventive health recommendations  were provided to patient.  See attached scanned questionnaire for additional information.   Signed,   Tyson Dense, RN Nurse Health Advisor  Patient Concerns: Overactive bladder. Pain in hands and lower back

## 2017-09-10 NOTE — Progress Notes (Signed)
This encounter was created in error - please disregard.

## 2017-09-15 ENCOUNTER — Encounter: Payer: Self-pay | Admitting: Nurse Practitioner

## 2017-09-15 ENCOUNTER — Ambulatory Visit (INDEPENDENT_AMBULATORY_CARE_PROVIDER_SITE_OTHER): Payer: Medicare Other | Admitting: Nurse Practitioner

## 2017-09-15 VITALS — BP 132/82 | HR 70 | Temp 98.4°F | Ht 61.0 in | Wt 113.8 lb

## 2017-09-15 DIAGNOSIS — M8580 Other specified disorders of bone density and structure, unspecified site: Secondary | ICD-10-CM

## 2017-09-15 DIAGNOSIS — I10 Essential (primary) hypertension: Secondary | ICD-10-CM | POA: Diagnosis not present

## 2017-09-15 DIAGNOSIS — Z0000 Encounter for general adult medical examination without abnormal findings: Secondary | ICD-10-CM | POA: Diagnosis not present

## 2017-09-15 DIAGNOSIS — R413 Other amnesia: Secondary | ICD-10-CM

## 2017-09-15 DIAGNOSIS — N3281 Overactive bladder: Secondary | ICD-10-CM

## 2017-09-15 DIAGNOSIS — G5603 Carpal tunnel syndrome, bilateral upper limbs: Secondary | ICD-10-CM

## 2017-09-15 LAB — COMPLETE METABOLIC PANEL WITH GFR
AG RATIO: 1.8 (calc) (ref 1.0–2.5)
ALBUMIN MSPROF: 4.3 g/dL (ref 3.6–5.1)
ALKALINE PHOSPHATASE (APISO): 56 U/L (ref 33–130)
ALT: 16 U/L (ref 6–29)
AST: 19 U/L (ref 10–35)
BILIRUBIN TOTAL: 0.5 mg/dL (ref 0.2–1.2)
BUN/Creatinine Ratio: 23 (calc) — ABNORMAL HIGH (ref 6–22)
BUN: 22 mg/dL (ref 7–25)
CHLORIDE: 106 mmol/L (ref 98–110)
CO2: 29 mmol/L (ref 20–32)
CREATININE: 0.95 mg/dL — AB (ref 0.60–0.93)
Calcium: 9.7 mg/dL (ref 8.6–10.4)
GFR, Est African American: 67 mL/min/{1.73_m2} (ref >=60)
GFR, Est Non African American: 58 mL/min/{1.73_m2} — ABNORMAL LOW (ref >=60)
GLOBULIN: 2.4 g/dL (ref 1.9–3.7)
Glucose, Bld: 98 mg/dL (ref 65–139)
POTASSIUM: 4.3 mmol/L (ref 3.5–5.3)
SODIUM: 142 mmol/L (ref 135–146)
Total Protein: 6.7 g/dL (ref 6.1–8.1)

## 2017-09-15 LAB — CBC WITH DIFFERENTIAL/PLATELET
BASOS PCT: 0.5 %
Basophils Absolute: 29 cells/uL (ref 0–200)
Eosinophils Absolute: 23 cells/uL (ref 15–500)
Eosinophils Relative: 0.4 %
HCT: 41.1 % (ref 35.0–45.0)
Hemoglobin: 13.4 g/dL (ref 11.7–15.5)
Lymphs Abs: 1060 cells/uL (ref 850–3900)
MCH: 28.3 pg (ref 27.0–33.0)
MCHC: 32.6 g/dL (ref 32.0–36.0)
MCV: 86.7 fL (ref 80.0–100.0)
MONOS PCT: 7.2 %
MPV: 10.6 fL (ref 7.5–12.5)
NEUTROS PCT: 73.3 %
Neutro Abs: 4178 cells/uL (ref 1500–7800)
PLATELETS: 264 10*3/uL (ref 140–400)
RBC: 4.74 10*6/uL (ref 3.80–5.10)
RDW: 11.7 % (ref 11.0–15.0)
TOTAL LYMPHOCYTE: 18.6 %
WBC: 5.7 10*3/uL (ref 3.8–10.8)
WBCMIX: 410 {cells}/uL (ref 200–950)

## 2017-09-15 MED ORDER — PREGABALIN 50 MG PO CAPS: 50.0000 mg | ORAL_CAPSULE | Freq: Two times a day (BID) | ORAL | 1 refills | Status: DC

## 2017-09-15 MED ORDER — SOLIFENACIN SUCCINATE 5 MG PO TABS: 5.0000 mg | ORAL_TABLET | Freq: Every day | ORAL | 3 refills | Status: DC

## 2017-09-15 NOTE — Progress Notes (Signed)
Provider: Lauree Chandler, NP  Patient Care Team: Lauree Chandler, NP as PCP - General (Geriatric Medicine)  Extended Emergency Contact Information Primary Emergency Contact: Bone,Fred Address: [address]          [place], [place] [address] Johnnette Litter of [place] Phone: [phone] Relation: Son Secondary Emergency Contact: Gareth [place] of [place] Phone: [phone] Relation: Relative No Known Allergies Code Status: FULL Goals of Care: Advanced Directive information Advanced Directives 09/15/2017  Does Patient Have a Medical Advance Directive? No  Type of Advance Directive -  Does patient want to make changes to medical advance directive? -  Copy of [place] in Chart? -  Would patient like information on creating a medical advance directive? -     Chief Complaint  Patient presents with  . Medical Management of Chronic Issues    Pt is being seen for a CPE.   . Medication Refill    Rx for lyrica pended  . ACP    Needed    HPI: Patient is a 77 y.o. female seen in today for an annual wellness exam.   No Major illnesses or hospitalization in the last year  Depression screen [facility] 2/9 09/09/2017 08/05/2016 06/30/2016  Decreased Interest 3 0 0  Down, Depressed, Hopeless 3 1 3   PHQ - 2 Score 6 1 3   Altered sleeping 0 - 0  Tired, decreased energy 2 - 3  Change in appetite 1 - 0  Feeling bad or failure about yourself  1 - 3  Trouble concentrating 1 - 3  Moving slowly or fidgety/restless 0 - 3  Suicidal thoughts 0 - 0  PHQ-9 Score 11 - 15  Difficult doing work/chores - - Very difficult    Fall Risk  09/15/2017 09/09/2017 04/02/2017 12/08/2016 09/01/2016  Falls in the past year? No No No No No   MMSE - Mini Mental State Exam 09/09/2017 08/05/2016 06/30/2016  Not completed: Refused (No Data) -  Orientation to time 3 - 3  Orientation to Place 4 - 4  Registration 3 - 3  Attention/ Calculation 0 - 0  Recall 0 - 0  Language- name 2  objects 2 - 2  Language- repeat 0 - 0  Language- follow 3 step command 3 - 2  Language- read & follow direction 1 - 1  Write a sentence 1 - 1  Copy design 0 - 0  Total score 17 - 16     Health Maintenance  Topic Date Due  . PNA vac Low Risk Adult (1 of 2 - PCV13) 06/24/2018 (Originally 06/04/2005)  . TETANUS/TDAP  10/03/2018 (Originally 06/05/1959)  . INFLUENZA VACCINE  12/03/2017  . DEXA SCAN  Completed      Past Medical History:  Diagnosis Date  . Anemia   . Anxiety   . Aortic atherosclerosis ([place])   . Arthritis   . Back pain   . Chest pain   . Chronic renal disease, stage II   . Constipation   . Dementia    frontemporal  . Depression   . Fibromyositis   . Glaucoma    right eye  . Gross hematuria   . Hair loss   . Hemorrhoids   . History of pneumothorax    left  . Horseshoe kidney   . Hyperlipidemia   . Hypertension   . Hypertensive renal disease, benign   . Malignant neoplasm of colon ([place])   . Multiple rib fractures  Left side: eibs 3 through 6  . Osteopenia   . Osteoporosis   . Personal history of DVT (deep vein thrombosis)   . Problems with hearing   . Renal cyst   . Urinary urgency   . Visual problems   . Vitamin D deficiency     Past Surgical History:  Procedure Laterality Date  . BREAST EXCISIONAL BIOPSY Left 1993   no visible scar  . BREAST EXCISIONAL BIOPSY Left 1992   no visible scar  . BREAST SURGERY  1970's   removed 2 benign lumps  . LUNG SURGERY     left lung punctured-surgically repaired  . TOTAL ABDOMINAL HYSTERECTOMY      Social History   Socioeconomic History  . Marital status: Single    Spouse name: Not on file  . Number of children: Not on file  . Years of education: Not on file  . Highest education level: Not on file  Occupational History  . Not on file  Social Needs  . Financial resource strain: Not hard at all  . Food insecurity:    Worry: Never true    Inability: Never true  . Transportation needs:     Medical: No    Non-medical: No  Tobacco Use  . Smoking status: Never Smoker  . Smokeless tobacco: Never Used  Substance and Sexual Activity  . Alcohol use: No  . Drug use: No  . Sexual activity: Not Currently  Lifestyle  . Physical activity:    Days per week: 0 days    Minutes per session: 0 min  . Stress: Rather much  Relationships  . Social connections:    Talks on phone: Once a week    Gets together: Once a week    Attends religious service: More than 4 times per year    Active member of club or organization: No    Attends meetings of clubs or organizations: Never    Relationship status: Never married  Other Topics Concern  . Not on file  Social History Narrative   Social History      Diet? no      Do you drink/eat things with caffeine? yes      Marital status?                                    What year were you married?      Do you live in a house, apartment, assisted living, condo, trailer, etc.? house      Is it one or more stories? 1 story      How many persons live in your home? none      Do you have any pets in your home? (please list) no      Highest level of education completed? 12 grade      Current or past profession: [employer] retirement. [employer]      Advanced Directives      Do you exercise?      Some time                                Type & how often? Leg and arm raises 2 x week      Do you have a living will? no      Do you have a DNR form?   no  If not, do you want to discuss one? no      Do you have signed POA/HPOA for forms?       Functional Status      Do you have difficulty bathing or dressing yourself? no      Do you have difficulty preparing food or eating? no      Do you have difficulty managing your medications? no      Do you have difficulty managing your finances? no      Do you have difficulty affording your medications? no    Family History  Problem Relation Age of  Onset  . Prostate cancer Father   . Hypertension Mother        brother also  . Emphysema Mother   . Lung cancer Sister   . Colon cancer Neg Hx     Review of Systems:  Review of Systems  Reason unable to perform ROS: memory loss makes ROS difficult to obtain.  Constitutional: Negative for activity change, appetite change, fatigue and unexpected weight change.  HENT: Negative for congestion and hearing loss.   Eyes: Negative.   Respiratory: Negative for cough and shortness of breath.   Cardiovascular: Negative for chest pain, palpitations and leg swelling.  Gastrointestinal: Negative for abdominal pain, constipation and diarrhea.  Genitourinary: Positive for frequency. Negative for difficulty urinating and dysuria.  Musculoskeletal: Positive for arthralgias and myalgias.  Skin: Negative for color change and wound.  Neurological: Positive for numbness (bilateral hands). Negative for dizziness and weakness.  Psychiatric/Behavioral: Positive for confusion, decreased concentration, dysphoric mood and hallucinations. Negative for agitation and behavioral problems.     Allergies as of 09/15/2017   No Known Allergies     Medication List        Accurate as of 09/15/17 11:06 AM. Always use your most recent med list.          acetaminophen 325 MG tablet Commonly known as:  TYLENOL Take 650 mg by mouth every 6 (six) hours as needed.   amLODipine 2.5 MG tablet Commonly known as:  NORVASC Take 1 tablet (2.5 mg total) by mouth daily.   cholecalciferol 1000 units tablet Commonly known as:  VITAMIN D Take 1 tablet (1,000 Units total) by mouth daily.   dorzolamide-timolol 22.3-6.8 MG/ML ophthalmic solution Commonly known as:  COSOPT INSTILL 1 DROP INTO BOTH EYES TWICE DAILY   latanoprost 0.005 % ophthalmic solution Commonly known as:  XALATAN INSTILL 1 DROP INTO BOTH EYES AT BEDTIME   losartan 100 MG tablet Commonly known as:  COZAAR Take 1 tablet (100 mg total) by mouth  daily.   predniSONE 5 MG tablet Commonly known as:  DELTASONE Take 1 tablet (5 mg total) by mouth daily.   pregabalin 50 MG capsule Commonly known as:  LYRICA Take 1 capsule (50 mg total) by mouth 2 (two) times daily.         Physical Exam: Vitals:   09/15/17 1101  BP: 132/82  Pulse: 70  Temp: 98.4 F (36.9 C)  TempSrc: Oral  SpO2: 96%  Weight: 113 lb 12.8 oz (51.6 kg)  Height: 5\' 1"  (1.549 m)   Body mass index is 21.5 kg/m. Physical Exam  Constitutional: She appears well-developed and well-nourished. No distress.  HENT:  Head: Normocephalic and atraumatic.  Right Ear: External ear normal.  Left Ear: External ear normal.  Nose: Nose normal.  Mouth/Throat: No oropharyngeal exudate.  Eyes: Pupils are equal, round, and reactive to light. Conjunctivae are normal.  Neck:  Normal range of motion. Neck supple.  Cardiovascular: Normal rate, regular rhythm, normal heart sounds and intact distal pulses.  Pulmonary/Chest: Effort normal and breath sounds normal.  Abdominal: Soft. Bowel sounds are normal. She exhibits no distension and no mass. There is no tenderness. There is no rebound and no guarding.  Musculoskeletal: Normal range of motion. She exhibits tenderness. She exhibits no edema.       Cervical back: She exhibits tenderness. She exhibits normal range of motion.  Kyphosis Grip strength normal bilaterally Decrease sensation to monofilament on left hand/fingertips  Neurological: She is alert.  Skin: Skin is warm and dry. She is not diaphoretic.  Psychiatric: Her speech is rapid and/or pressured. She expresses impulsivity.    Labs reviewed: Basic Metabolic Panel: Recent Labs    10/03/16 1326 01/30/17 1248  NA 138 142  K 3.8 4.2  CL 107 109  CO2 23 26  GLUCOSE 136* 88  BUN 12 17  CREATININE 0.99 0.94*  CALCIUM 9.2 9.2   Liver Function Tests: Recent Labs    01/30/17 1248  AST 26  ALT 15  BILITOT 0.8  PROT 6.6   No results for input(s): LIPASE,  AMYLASE in the last 8760 hours. No results for input(s): AMMONIA in the last 8760 hours. CBC: Recent Labs    10/03/16 1326 10/06/16 1457  WBC 5.0 5.1  NEUTROABS  --  3,366  HGB 12.8 13.2  HCT 40.2 41.2  MCV 87.0 87.7  PLT 232 269   Lipid Panel: No results for input(s): CHOL, HDL, LDLCALC, TRIG, CHOLHDL, LDLDIRECT in the last 8760 hours. Lab Results  Component Value Date   HGBA1C 5.3 11/17/2014    Procedures: No results found.  Assessment/Plan 1. Bilateral carpal tunnel syndrome Stable on lyrica - pregabalin (LYRICA) 50 MG capsule; Take 1 capsule (50 mg total) by mouth 2 (two) times daily.  Dispense: 60 capsule; Refill: 1  2. Overactive bladder -contributes hematuria to myrebriq and will not take. Complains frequently of OAB throughout the visit. Will try vesicare at this time - solifenacin (VESICARE) 5 MG tablet; Take 1 tablet (5 mg total) by mouth daily.  Dispense: 30 tablet; Refill: 3  3. Essential hypertension -stable on losartan 100 mg dialy with norvasc  - EKG 12-Lead- NSR rate 60 - COMPLETE METABOLIC PANEL WITH GFR - CBC with Differential/Platelets  4. Memory loss -MMSE unchanged from last year, TSH and RPR obtained previously which were in normal range. Will need CT of head to further evaluate memory loss.  - CT Head Wo Contrast; Future  5. Osteopenia, unspecified location Recommended to take caltrate with D 600/400 twice a day with weigh bearing activity 30 mins 5 days a week.   6. Wellness exam -memory loss is a concern. Pt reports she is able to manage herself at this time. Has transportation that helps her get to appts.  Discussed living will, healthcare power of attorney.  -does not wish for any vaccines today -following with ophthalmologist routinely -encouraged dental appt due to dental caries and poor dentition. Reports she plans to make appt.  mammogram done 2018 dexa 08/29/16   Next appt: 4 months.  Carlos American. [place], [facility] [phone]

## 2017-09-15 NOTE — Patient Instructions (Addendum)
To make sure to schedule dental appt   To think about who would make decisions if you were not able to. Encouraged to complete advance care planning paperwork. Living will, healthcare power of attorney   Make sure you review your medication list and are taking medications as prescribed   Recommended to take caltrate with D 600/400 twice a day due to low bone mass with weight bearing activity  To start vesicare 5 mg daily for overactive bladder

## 2017-09-17 ENCOUNTER — Emergency Department (HOSPITAL_COMMUNITY)
Admission: EM | Admit: 2017-09-17 | Discharge: 2017-09-17 | Disposition: A | Discharge status: Home / Self Care | Payer: Medicare Other | Attending: Emergency Medicine | Admitting: Emergency Medicine

## 2017-09-17 ENCOUNTER — Encounter (HOSPITAL_COMMUNITY): Payer: Self-pay

## 2017-09-17 ENCOUNTER — Other Ambulatory Visit: Payer: Self-pay

## 2017-09-17 ENCOUNTER — Emergency Department (HOSPITAL_COMMUNITY): Payer: Medicare Other

## 2017-09-17 DIAGNOSIS — Y92512 Supermarket, store or market as the place of occurrence of the external cause: Secondary | ICD-10-CM | POA: Insufficient documentation

## 2017-09-17 DIAGNOSIS — Y9389 Activity, other specified: Secondary | ICD-10-CM | POA: Insufficient documentation

## 2017-09-17 DIAGNOSIS — S0990XA Unspecified injury of head, initial encounter: Secondary | ICD-10-CM

## 2017-09-17 DIAGNOSIS — I129 Hypertensive chronic kidney disease with stage 1 through stage 4 chronic kidney disease, or unspecified chronic kidney disease: Secondary | ICD-10-CM | POA: Diagnosis not present

## 2017-09-17 DIAGNOSIS — F0390 Unspecified dementia without behavioral disturbance: Secondary | ICD-10-CM | POA: Insufficient documentation

## 2017-09-17 DIAGNOSIS — Y999 Unspecified external cause status: Secondary | ICD-10-CM | POA: Diagnosis not present

## 2017-09-17 DIAGNOSIS — W1830XA Fall on same level, unspecified, initial encounter: Secondary | ICD-10-CM | POA: Diagnosis not present

## 2017-09-17 DIAGNOSIS — Z79899 Other long term (current) drug therapy: Secondary | ICD-10-CM | POA: Diagnosis not present

## 2017-09-17 DIAGNOSIS — N182 Chronic kidney disease, stage 2 (mild): Secondary | ICD-10-CM | POA: Diagnosis not present

## 2017-09-17 DIAGNOSIS — S01512A Laceration without foreign body of oral cavity, initial encounter: Secondary | ICD-10-CM | POA: Insufficient documentation

## 2017-09-17 DIAGNOSIS — I2510 Atherosclerotic heart disease of native coronary artery without angina pectoris: Secondary | ICD-10-CM | POA: Diagnosis not present

## 2017-09-17 DIAGNOSIS — Q631 Lobulated, fused and horseshoe kidney: Secondary | ICD-10-CM | POA: Diagnosis not present

## 2017-09-17 DIAGNOSIS — Z23 Encounter for immunization: Secondary | ICD-10-CM | POA: Insufficient documentation

## 2017-09-17 DIAGNOSIS — S01111A Laceration without foreign body of right eyelid and periocular area, initial encounter: Secondary | ICD-10-CM

## 2017-09-17 MED ORDER — TETANUS-DIPHTH-ACELL PERTUSSIS 5-2.5-18.5 LF-MCG/0.5 IM SUSP
0.5000 mL | Freq: Once | INTRAMUSCULAR | Status: AC
  Administered 2017-09-17: 0.5 mL via INTRAMUSCULAR
  Filled 2017-09-17: qty 0.5

## 2017-09-17 MED ORDER — LIDOCAINE-EPINEPHRINE 1 %-1:100000 IJ SOLN
10.0000 mL | Freq: Once | INTRAMUSCULAR | Status: AC
  Administered 2017-09-17: 10 mL
  Filled 2017-09-17: qty 10

## 2017-09-17 NOTE — ED Triage Notes (Signed)
[place].  Denies loss of consciousness, blurred vision or dizziness.  Hx of glaucoma on right eye at baseline for sight. Endorses right arm and elbow pain. A&Ox4, difficult to get history from.

## 2017-09-17 NOTE — ED Notes (Addendum)
EDP applied Dermabond skin glue at pt.'s forehead skin laceration and explained plan of care to pt. Marland Kitchen

## 2017-09-17 NOTE — Progress Notes (Signed)
CSW reached out to pt's son, left voice mail to gain more information about pt's home life. CSW attempted to call pt's friend, Hassan Rowan, listed as an emergency contact at [phone]. Pt's friend's number is disconnected.   Wendelyn Breslow, [facility]  [phone]

## 2017-09-17 NOTE — Discharge Instructions (Signed)
KEEP WOUNDS CLEAN AND DRY. YOU DO NOT NEED TO PUT ANY MEDICINE ON WOUNDS. EAT SOFT DIET AND AVOID ANY CITRUS OR SALTY FOODS. FOLLOW UP WITH DENTIST FOR YOUR TOOTH INJURY. COME BACK TO ER IF YOU HAVE ANY VOMITING, CONFUSION, PROBLEMS WALKING, OR SEVERE PAIN.

## 2017-09-17 NOTE — ED Provider Notes (Signed)
[facility] EMERGENCY DEPARTMENT Provider Note   CSN: [id number] Arrival date & time: 09/17/17  1543     History   Chief Complaint Chief Complaint  Patient presents with  . Fall  . Laceration    HPI Kimberly Mclean is a 77 y.o. female.  77 year old female with past medical history including frontotemporal dementia, right eye blindness, CKD, hypertension, DVT who presents with fall.  The patient was at a shopping center today and fell from standing.  She does not recall details but thinks that she just tripped or lost her balance.  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  She repeatedly asks for a phone book to call her beauty salon and let them know she is not coming.  LEVEL 5 CAVEAT DUE TO DEMENTIA  The history is provided by the patient.  Fall   Laceration      Past Medical History:  Diagnosis Date  . Anemia   . Anxiety   . Aortic atherosclerosis ([place])   . Arthritis   . Back pain   . Chest pain   . Chronic renal disease, stage II   . Constipation   . Dementia    frontemporal  . Depression   . Fibromyositis   . Glaucoma    right eye  . Gross hematuria   . Hair loss   . Hemorrhoids   . History of pneumothorax    left  . Horseshoe kidney   . Hyperlipidemia   . Hypertension   . Hypertensive renal disease, benign   . Malignant neoplasm of colon ([place])   . Multiple rib fractures    Left side: eibs 3 through 6  . Osteopenia   . Osteoporosis   . Personal history of DVT (deep vein thrombosis)   . Problems with hearing   . Renal cyst   . Urinary urgency   . Visual problems   . Vitamin D deficiency     Patient Active Problem List   Diagnosis Date Noted  . Carpal tunnel syndrome 12/08/2016  . Osteoporosis 10/14/2016  . Memory deficit 10/14/2016  . Diverticulosis 01/16/2012  . Hemorrhoids 01/16/2012  . HTN (hypertension) 01/16/2012    Past  Surgical History:  Procedure Laterality Date  . BREAST EXCISIONAL BIOPSY Left 1993   no visible scar  . BREAST EXCISIONAL BIOPSY Left 1992   no visible scar  . BREAST SURGERY  1970's   removed 2 benign lumps  . LUNG SURGERY     left lung punctured-surgically repaired  . TOTAL ABDOMINAL HYSTERECTOMY       OB History   None      Home Medications    Prior to Admission medications   Medication Sig Start Date End Date Taking? Authorizing Provider  acetaminophen (TYLENOL) 325 MG tablet Take 650 mg by mouth every 6 (six) hours as needed.    [provider]  amLODipine (NORVASC) 2.5 MG tablet Take 1 tablet (2.5 mg total) by mouth daily. 12/08/16   Lauree Chandler, NP  cholecalciferol (VITAMIN D) 1000 units tablet Take 1 tablet (1,000 Units total) by mouth daily. 01/30/17   Gildardo Cranker, DO  dorzolamide-timolol (COSOPT) 22.3-6.8 MG/ML ophthalmic solution INSTILL 1 DROP INTO BOTH EYES TWICE DAILY 12/13/15   [provider]  latanoprost (XALATAN) 0.005 % ophthalmic solution INSTILL 1 DROP INTO BOTH EYES AT BEDTIME 03/10/16   [provider]  losartan (COZAAR) 100 MG tablet Take 1 tablet (100 mg total) by mouth daily. 12/08/16   Lauree Chandler, NP  predniSONE (DELTASONE) 5 MG tablet Take 1 tablet (5 mg total) by mouth daily. 01/30/17   Gildardo Cranker, DO  pregabalin (LYRICA) 50 MG capsule Take 1 capsule (50 mg total) by mouth 2 (two) times daily. 09/15/17   Lauree Chandler, NP  solifenacin (VESICARE) 5 MG tablet Take 1 tablet (5 mg total) by mouth daily. 09/15/17   Lauree Chandler, NP    Family History Family History  Problem Relation Age of Onset  . Prostate cancer Father   . Hypertension Mother        brother also  . Emphysema Mother   . Lung cancer Sister   . Colon cancer Neg Hx     Social History Social History   Tobacco Use  . Smoking status: Never Smoker  . Smokeless tobacco: Never Used  Substance Use Topics  . Alcohol use: No  . Drug  use: No     Allergies   Patient has no known allergies.   Review of Systems Review of Systems  Unable to perform ROS: Dementia     Physical Exam Updated Vital Signs BP (!) 158/98 (BP Location: Left Arm)   Pulse 61   Temp 98.8 F (37.1 C) (Oral)   Resp 16   Ht 5\' 1"  (1.549 m)   Wt 51.3 kg (113 lb)   SpO2 100%   BMI 21.35 kg/m   Physical Exam  Constitutional: She appears well-developed and well-nourished. No distress.  Thin, frail elderly woman awake and alert, digging through purse  HENT:  Head: Normocephalic.  1cm laceration on lateral edge of R eyebrow and abrasion above R eyebrow; large laceration on inside of central lower lip and separate small lac on outer lip with no communication w/ inner lac; no full-thickness wounds, R central incisor chipped, no obvious dentin exposure  Eyes: Conjunctivae are normal.  R eye blindness, L pupil round and reactive to light; faint R upper eyelid ecchymosis  Neck: Neck supple.  Cardiovascular: Normal rate, regular rhythm and normal heart sounds.  No murmur heard. Pulmonary/Chest: Effort normal and breath sounds normal.  Abdominal: Soft. Bowel sounds are normal. She exhibits no distension. There is no tenderness.  Musculoskeletal: Normal range of motion. She exhibits no edema, tenderness or deformity.  Neurological: She is alert.  Oriented to person and place  Skin: Skin is warm and dry.  Psychiatric:  Mildly agitated, repetitive questioning  Nursing note and vitals reviewed.    ED Treatments / Results  Labs (all labs ordered are listed, but only abnormal results are displayed) Labs Reviewed - No data to display  EKG None  Radiology Dg Elbow Complete Right  Result Date: 09/17/2017 CLINICAL DATA:  Recent trip and fall with elbow pain, initial encounter EXAM: RIGHT ELBOW - COMPLETE 3+ VIEW COMPARISON:  None. FINDINGS: Mild degenerative changes are noted with remodeling of the radial head and osteophytes arising from the  proximal ulna. Mild joint effusion is seen. No acute fracture or dislocation is noted. No gross soft tissue abnormality is noted. IMPRESSION: Mild joint effusion. Degenerative changes of the elbow joint are seen without acute abnormality. Electronically Signed   By: Inez Catalina M.D.   On: 09/17/2017 16:40   Ct Head Wo Contrast  Result Date: 09/17/2017 CLINICAL DATA:  Trip and fall with headaches, initial encounter EXAM: CT HEAD WITHOUT CONTRAST TECHNIQUE: Contiguous axial images  were obtained from the base of the skull through the vertex without intravenous contrast. COMPARISON:  None. FINDINGS: Brain: Mild atrophic changes are noted. Chronic white matter ischemic change is seen. No findings to suggest acute hemorrhage, acute infarction or space-occupying mass lesion are noted. Vascular: No hyperdense vessel or unexpected calcification. Skull: Normal. Negative for fracture or focal lesion. Sinuses/Orbits: Mild mucosal thickening is noted within the right maxillary and ethmoid sinuses. Other: None. IMPRESSION: Chronic atrophic and ischemic changes without acute abnormality. Electronically Signed   By: Inez Catalina M.D.   On: 09/17/2017 17:26    Procedures .Marland KitchenLaceration Repair Date/Time: 09/17/2017 7:22 PM Performed by: Sharlett Iles, MD Authorized by: Sharlett Iles, MD   Consent:    Consent obtained:  Verbal   Consent given by:  Patient   Risks discussed:  Infection, poor cosmetic result and poor wound healing Anesthesia (see MAR for exact dosages):    Anesthesia method:  None Laceration details:    Location:  Face   Face location:  R eyebrow   Length (cm):  1.5 Repair type:    Repair type:  Simple Treatment:    Area cleansed with:  Saline   Amount of cleaning:  Standard Skin repair:    Repair method:  Tissue adhesive Approximation:    Approximation:  Close Post-procedure details:    Dressing:  Open (no dressing)   Patient tolerance of procedure:  Tolerated well, no  immediate complications .Marland KitchenLaceration Repair Date/Time: 09/17/2017 7:23 PM Performed by: Sharlett Iles, MD Authorized by: Sharlett Iles, MD   Consent:    Consent obtained:  Verbal   Consent given by:  Patient   Risks discussed:  Infection and poor wound healing Anesthesia (see MAR for exact dosages):    Anesthesia method:  Local infiltration   Local anesthetic:  Lidocaine 1% WITH epi Laceration details:    Location:  Mouth   Mouth location: inside of lower lip.   Length (cm):  3 Repair type:    Repair type:  Simple Skin repair:    Repair method:  Sutures   Suture size:  4-0   Wound skin closure material used: Vicryl.   Suture technique:  Simple interrupted   Number of sutures:  2 Approximation:    Approximation:  Close Post-procedure details:    Dressing:  Open (no dressing)   Patient tolerance of procedure:  Tolerated well, no immediate complications   (including critical care time)  Medications Ordered in ED Medications  lidocaine-EPINEPHrine (XYLOCAINE W/EPI) 1 %-1:100000 (with pres) injection 10 mL (10 mLs Other Given by Other 09/17/17 1646)     Initial Impression / Assessment and Plan / ED Course  I have reviewed the triage vital signs and the nursing notes.  Pertinent labs & imaging results that were available during my care of the patient were reviewed by me and considered in my medical decision making (see chart for details).    Pt was mildly agitated on exam and would not answer most of my questions. Injuries as above. XR elbow obtained w/ effusion and degenerative changes, no deformity and normal ROM on exam. She didn't report elbow pain to me.   Imaging negative for acute injury.  Repaired lacerations at bedside, see procedure note for detail.  Sister-in-law was present and I explained her wound care instructions, made multiple attempts to contact son by phone without success.  I have written everything out on discharge paperwork.  It appears  that patient does follow regularly with PCP  and just recently was in the clinic.  She also reports that she has a dentist with whom she can follow for her tooth injury.  Reviewed supportive care measures including soft diet, avoidance of high acid foods, and ice.  Return precautions reviewed with patient and sister-in-law.  They voiced understanding.  Final Clinical Impressions(s) / ED Diagnoses   Final diagnoses:  None    ED Discharge Orders    None       Little, Wenda Overland, MD 09/17/17 [phone]

## 2017-09-17 NOTE — ED Notes (Signed)
Dermabond placed at the beside.

## 2017-09-23 ENCOUNTER — Ambulatory Visit (INDEPENDENT_AMBULATORY_CARE_PROVIDER_SITE_OTHER): Payer: Medicare Other | Admitting: Nurse Practitioner

## 2017-09-23 ENCOUNTER — Ambulatory Visit: Payer: Medicare Other | Admitting: Nurse Practitioner

## 2017-09-23 ENCOUNTER — Encounter: Payer: Self-pay | Admitting: Nurse Practitioner

## 2017-09-23 VITALS — BP 128/84 | HR 64 | Temp 97.8°F | Ht 61.0 in | Wt 111.0 lb

## 2017-09-23 DIAGNOSIS — R2681 Unsteadiness on feet: Secondary | ICD-10-CM

## 2017-09-23 DIAGNOSIS — S0093XD Contusion of unspecified part of head, subsequent encounter: Secondary | ICD-10-CM | POA: Diagnosis not present

## 2017-09-23 DIAGNOSIS — R413 Other amnesia: Secondary | ICD-10-CM | POA: Diagnosis not present

## 2017-09-23 DIAGNOSIS — W19XXXD Unspecified fall, subsequent encounter: Secondary | ICD-10-CM

## 2017-09-23 DIAGNOSIS — S01111D Laceration without foreign body of right eyelid and periocular area, subsequent encounter: Secondary | ICD-10-CM

## 2017-09-23 NOTE — Progress Notes (Signed)
Careteam: Patient Care Team: Lauree Chandler, NP as PCP - General (Geriatric Medicine)  Advanced Directive information Does Patient Have a Medical Advance Directive?: No  No Known Allergies  Chief Complaint  Patient presents with  . Follow-up    Pt is being seen for an ED follow up. Pt was seen at [facility] on 09/17/17 due to a fall. Pt had laceration to right eyebrow and inside of lower lip. Pt has 2 sutures in lower lip. Pt unaware that she has any suturs at all.      HPI: Patient is a 77 y.o. female seen in the office today to follow up ED visit after fall with laceration. Ms Minardi reports she is feeling very sore after the fall. Had a fall on 09/17/17, unsure of events with fall.  Reports her church people are coming over to check on her. Admits unsteady gait. Pain has improved. Plans to go to the dentist to have them look at her teeth.  Reports she is eating and drinking without problems.   Talks about how her mother raised her son, she does not see him routinely. Makes commends that she can not make him do things he does not wish to do.   Reports she is an independent person but glaucoma is taking some of her independence  and admits memory has been an issue.   Review of Systems:  Review of Systems  Constitutional: Negative for chills, fever, malaise/fatigue and weight loss.  Eyes: Positive for blurred vision.       Vision loss in right eye due to glaucoma   Cardiovascular: Negative for chest pain and palpitations.  Musculoskeletal: Positive for falls and myalgias.  Neurological: Negative for dizziness and headaches.    Past Medical History:  Diagnosis Date  . Anemia   . Anxiety   . Aortic atherosclerosis ([place])   . Arthritis   . Back pain   . Chest pain   . Chronic renal disease, stage II   . Constipation   . Dementia    frontemporal  . Depression   . Fibromyositis   . Glaucoma    right eye  . Gross hematuria   . Hair loss   . Hemorrhoids   . History of  pneumothorax    left  . Horseshoe kidney   . Hyperlipidemia   . Hypertension   . Hypertensive renal disease, benign   . Malignant neoplasm of colon ([place])   . Multiple rib fractures    Left side: eibs 3 through 6  . Osteopenia   . Osteoporosis   . Personal history of DVT (deep vein thrombosis)   . Problems with hearing   . Renal cyst   . Urinary urgency   . Visual problems   . Vitamin D deficiency    Past Surgical History:  Procedure Laterality Date  . BREAST EXCISIONAL BIOPSY Left 1993   no visible scar  . BREAST EXCISIONAL BIOPSY Left 1992   no visible scar  . BREAST SURGERY  1970's   removed 2 benign lumps  . LUNG SURGERY     left lung punctured-surgically repaired  . TOTAL ABDOMINAL HYSTERECTOMY     Social History:   reports that she has never smoked. She has never used smokeless tobacco. She reports that she does not drink alcohol or use drugs.  Family History  Problem Relation Age of Onset  . Prostate cancer Father   . Hypertension Mother        brother  also  . Emphysema Mother   . Lung cancer Sister   . Colon cancer Neg Hx     Medications: Patient's Medications  New Prescriptions   No medications on file  Previous Medications   ACETAMINOPHEN (TYLENOL) 325 MG TABLET    Take 650 mg by mouth every 6 (six) hours as needed.   AMLODIPINE (NORVASC) 2.5 MG TABLET    Take 1 tablet (2.5 mg total) by mouth daily.   CHOLECALCIFEROL (VITAMIN D) 1000 UNITS TABLET    Take 1 tablet (1,000 Units total) by mouth daily.   DORZOLAMIDE-TIMOLOL (COSOPT) 22.3-6.8 MG/ML OPHTHALMIC SOLUTION    INSTILL 1 DROP INTO BOTH EYES TWICE DAILY   LATANOPROST (XALATAN) 0.005 % OPHTHALMIC SOLUTION    INSTILL 1 DROP INTO BOTH EYES AT BEDTIME   LOSARTAN (COZAAR) 100 MG TABLET    Take 1 tablet (100 mg total) by mouth daily.   PREDNISONE (DELTASONE) 5 MG TABLET    Take 1 tablet (5 mg total) by mouth daily.   PREGABALIN (LYRICA) 50 MG CAPSULE    Take 1 capsule (50 mg total) by mouth 2 (two) times  daily.   SOLIFENACIN (VESICARE) 5 MG TABLET    Take 1 tablet (5 mg total) by mouth daily.  Modified Medications   No medications on file  Discontinued Medications   No medications on file     Physical Exam:  Vitals:   09/23/17 1047  BP: 128/84  Pulse: 64  Temp: 97.8 F (36.6 C)  TempSrc: Oral  SpO2: 96%  Weight: 111 lb (50.3 kg)  Height: 5\' 1"  (1.549 m)   Body mass index is 20.97 kg/m.  Physical Exam  Constitutional: She appears well-developed and well-nourished. No distress.  HENT:  Head: Normocephalic and atraumatic.  Right Ear: External ear normal.  Left Ear: External ear normal.  Nose: Nose normal.  Mouth/Throat: Mucous membranes are normal. No oropharyngeal exudate.  Lower lip swelling with laceration. Slight redness. No drainage noted. Upper teeth have been chipped after fall  Eyes: Right conjunctiva has a hemorrhage. Right pupil is not round and not reactive.  Right eye non-reactive and irregular due to glaucoma. ecchymosis to right orbit. Laceration above right eye with tissue adhesive   Neck: Normal range of motion. Neck supple.  Cardiovascular: Normal rate, regular rhythm, normal heart sounds and intact distal pulses.  Pulmonary/Chest: Effort normal and breath sounds normal.  Abdominal: Soft. Bowel sounds are normal. She exhibits no distension and no mass. There is no tenderness. There is no rebound and no guarding.  Musculoskeletal: She exhibits tenderness. She exhibits no edema.       Cervical back: She exhibits tenderness. She exhibits normal range of motion.  Kyphosis Grip strength normal bilaterally Slow gait, has cane but forgets to use it  Neurological: She is alert.  Skin: Skin is warm and dry. She is not diaphoretic.  Psychiatric: Cognition and memory are impaired. She expresses impulsivity. She exhibits abnormal recent memory.  Unaware of day of the week however she is aware today that her memory has been getting much worse    Labs  reviewed: Basic Metabolic Panel: Recent Labs    10/03/16 1326 01/30/17 1248 09/15/17 1203  NA 138 142 142  K 3.8 4.2 4.3  CL 107 109 106  CO2 23 26 29   GLUCOSE 136* 88 98  BUN 12 17 22   CREATININE 0.99 0.94* 0.95*  CALCIUM 9.2 9.2 9.7   Liver Function Tests: Recent Labs    01/30/17 1248 09/15/17  1203  AST 26 19  ALT 15 16  BILITOT 0.8 0.5  PROT 6.6 6.7   No results for input(s): LIPASE, AMYLASE in the last 8760 hours. No results for input(s): AMMONIA in the last 8760 hours. CBC: Recent Labs    10/03/16 1326 10/06/16 1457 09/15/17 1203  WBC 5.0 5.1 5.7  NEUTROABS  --  3,366 4,178  HGB 12.8 13.2 13.4  HCT 40.2 41.2 41.1  MCV 87.0 87.7 86.7  PLT 232 269 264   Lipid Panel: No results for input(s): CHOL, HDL, LDLCALC, TRIG, CHOLHDL, LDLDIRECT in the last 8760 hours. TSH: No results for input(s): TSH in the last 8760 hours. A1C: Lab Results  Component Value Date   HGBA1C 5.3 11/17/2014     Assessment/Plan 1. Unsteady gait/Fall, subsequent encounter -pt unsure of exact reasons for fall. Has a cane but does not use routinely, also does not use walker because it is too heavy. Reports she feels unsteady when she walks. No additional falls since 5/17.  - Ambulatory referral to [facility] for evaluation and treatment at this time.   2. Contusion of head, unspecified part of head, subsequent encounter -laceration to lip, eyebrow and with teeth damaged. Pt plans to follow up with dentist soon. Unsure of events leading up to fall. Overall pain is improving.  - Ambulatory referral to Home Health  3. Memory loss Progressive memory loss. Pt does not drive and getting a lot of assistance from church family. SW consult added to home health referral at this time.   4. Laceration of right eyebrow, subsequent encounter Stable, pain is improving, no signs of infection. continues with tissue adhesive.   Carlos American. [place], [facility] [phone]

## 2017-09-23 NOTE — Patient Instructions (Signed)
To make appt with dentist to have them repair teeth that have been damaged from the fall

## 2017-10-07 ENCOUNTER — Other Ambulatory Visit: Payer: Medicare Other

## 2017-10-29 ENCOUNTER — Other Ambulatory Visit: Payer: Self-pay

## 2017-10-29 DIAGNOSIS — I10 Essential (primary) hypertension: Secondary | ICD-10-CM

## 2017-10-29 MED ORDER — LOSARTAN POTASSIUM 100 MG PO TABS: 100.0000 mg | ORAL_TABLET | Freq: Every day | ORAL | 1 refills | Status: DC

## 2017-10-29 MED ORDER — AMLODIPINE BESYLATE 2.5 MG PO TABS
2.5000 mg | ORAL_TABLET | Freq: Every day | ORAL | 1 refills | Status: AC
Start: 2017-10-29 — End: ?

## 2017-10-29 NOTE — Telephone Encounter (Signed)
Patient called to request a refill on norvasc and losartan. Rx were sent to pharmacy electronically.

## 2017-11-09 ENCOUNTER — Emergency Department (HOSPITAL_COMMUNITY)
Admission: EM | Admit: 2017-11-09 | Discharge: 2017-11-10 | Disposition: A | Discharge status: Home / Self Care | Payer: Medicare Other | Attending: Emergency Medicine | Admitting: Emergency Medicine

## 2017-11-09 ENCOUNTER — Emergency Department (HOSPITAL_COMMUNITY): Payer: Medicare Other

## 2017-11-09 ENCOUNTER — Encounter (HOSPITAL_COMMUNITY): Payer: Self-pay

## 2017-11-09 DIAGNOSIS — S0083XA Contusion of other part of head, initial encounter: Secondary | ICD-10-CM | POA: Diagnosis not present

## 2017-11-09 DIAGNOSIS — Y92510 Bank as the place of occurrence of the external cause: Secondary | ICD-10-CM | POA: Insufficient documentation

## 2017-11-09 DIAGNOSIS — R55 Syncope and collapse: Secondary | ICD-10-CM | POA: Insufficient documentation

## 2017-11-09 DIAGNOSIS — Z79899 Other long term (current) drug therapy: Secondary | ICD-10-CM | POA: Insufficient documentation

## 2017-11-09 DIAGNOSIS — N182 Chronic kidney disease, stage 2 (mild): Secondary | ICD-10-CM | POA: Diagnosis not present

## 2017-11-09 DIAGNOSIS — Y939 Activity, unspecified: Secondary | ICD-10-CM | POA: Diagnosis not present

## 2017-11-09 DIAGNOSIS — Y999 Unspecified external cause status: Secondary | ICD-10-CM | POA: Insufficient documentation

## 2017-11-09 DIAGNOSIS — I129 Hypertensive chronic kidney disease with stage 1 through stage 4 chronic kidney disease, or unspecified chronic kidney disease: Secondary | ICD-10-CM | POA: Diagnosis not present

## 2017-11-09 DIAGNOSIS — W1839XA Other fall on same level, initial encounter: Secondary | ICD-10-CM | POA: Diagnosis not present

## 2017-11-09 DIAGNOSIS — F0390 Unspecified dementia without behavioral disturbance: Secondary | ICD-10-CM | POA: Insufficient documentation

## 2017-11-09 DIAGNOSIS — S098XXA Other specified injuries of head, initial encounter: Secondary | ICD-10-CM | POA: Diagnosis present

## 2017-11-09 LAB — URINALYSIS, ROUTINE W REFLEX MICROSCOPIC
BILIRUBIN URINE: NEGATIVE
Glucose, UA: NEGATIVE mg/dL
Hgb urine dipstick: NEGATIVE
Ketones, ur: 5 mg/dL — AB
LEUKOCYTES UA: NEGATIVE
NITRITE: NEGATIVE
Protein, ur: NEGATIVE mg/dL
SPECIFIC GRAVITY, URINE: 1.011 (ref 1.005–1.030)
pH: 7 (ref 5.0–8.0)

## 2017-11-09 MED ORDER — HALOPERIDOL LACTATE 5 MG/ML IJ SOLN
2.0000 mg | Freq: Once | INTRAMUSCULAR | Status: AC
  Administered 2017-11-09: 2 mg via INTRAMUSCULAR
  Filled 2017-11-09: qty 1

## 2017-11-09 MED ORDER — DIPHENHYDRAMINE HCL 50 MG/ML IJ SOLN
25.0000 mg | Freq: Once | INTRAMUSCULAR | Status: AC
  Administered 2017-11-09: 25 mg via INTRAMUSCULAR
  Filled 2017-11-09: qty 1

## 2017-11-09 NOTE — Progress Notes (Signed)
CSW consulted to provide assistance in contacting family/emergency contacts. CSW left voicemail for son, [name] at [phone].   After multiple attempts trying to contact pt's son and friend, listed in emergency contact, CSW called for Wellness Check at pt's address.   CSW waiting call back with update on Wellness Check.   Wendelyn Breslow, [facility]  [phone]

## 2017-11-09 NOTE — Progress Notes (Signed)
CSW made multiple attempts to speak with pt's sister in law, [place] at [phone]. CSW has not received phone call back from [place] or pt's son, [name].  Wendelyn Breslow, [facility]  [phone]

## 2017-11-09 NOTE — ED Notes (Signed)
Report given to oncoming RN.

## 2017-11-09 NOTE — ED Notes (Signed)
Transported from CT scan via stretcher

## 2017-11-09 NOTE — Discharge Instructions (Signed)

## 2017-11-09 NOTE — ED Triage Notes (Signed)
Pt brought in by EMS due to pt having [place] episode [place] the [place]. Pt hit her head when she LOC. Pt is a&ox1 and being repetitive. Pt was by herself at the bank and its unknown if pt is on blood thinners. Pt refused c-collar or IV with EMS.

## 2017-11-09 NOTE — ED Provider Notes (Addendum)
Blood pressure (!) 186/111, pulse 73, temperature 98.2 F (36.8 C), temperature source Oral, resp. rate 16, SpO2 100 %.  Assuming care from Dr. Venora Maples.  In short, Kimberly Mclean is a 77 y.o. female with a chief complaint of Loss of Consciousness .  Refer to the original H&P for additional details.  The current plan of care is to follow CT head.  04:57 PM Had a detailed discussion with the patient. She has a large hematoma over her right forehead.  I been unable to make contact with family. Patient is adamant about leaving the emergency department.  I tried to explain the need for a CT scan to rule out intracranial hemorrhage or fracture.  She is adamantly refusing.  She is asking to leave the emergency department at this time by cab which do not feel is safe without a CT scan. Patient agreeable to receive mild sedation for CT and get scan completed. No restraint required.   08:15 PM Patient head CT is negative. UA negative. Patient able to tell me her address and is adamant about discharge. She states she will call a cab an go home. She can call family from home. I have tried to reach family and social work has reached out as well. Patient does have some confusion but seems near baseline. She is ambulatory here. Plan for discharge home with [facility]AR to make sure she gets home without difficulty.   11:15 PM Spoke with patient's son, [name], by phone.  He call me back to say just got off of work.  He confirms his mother's home address.  He states that the confusion I am describing is baseline for her.  He is unable to come and pick her up but if an ambulance can bring her home he can check on her later in the evening/early morning. This plan at discharge seems reasonable.  Nanda Quinton, MD Emergency Medicine      Long, Wonda Olds, MD 11/09/17 2300    Margette Fast, MD 11/09/17 [phone]

## 2017-11-09 NOTE — ED Notes (Signed)
Pt assisted into bed after meds given (See mar); warm blankets applied; side rails up x2; stretcher in low position -  Pt conversant - calm, cooperative - continues to refuse CT scan

## 2017-11-09 NOTE — ED Notes (Signed)
Pt continues to refuse labwork and CT scan - ED MD aware of same

## 2017-11-09 NOTE — ED Notes (Signed)
RN called pts son to notify him that pt was in the ED. Voicemail left for son and awaiting a return phone call.

## 2017-11-09 NOTE — ED Notes (Signed)
Transported to CT scan via stretcher

## 2017-11-09 NOTE — ED Provider Notes (Addendum)
[place] EMERGENCY DEPARTMENT Provider Note   CSN: [id number] Arrival date & time: 11/09/17  1303     History   Chief Complaint Chief Complaint  Patient presents with  . Loss of Consciousness    HPI Kimberly Mclean is a 77 y.o. female.  The history is provided by the patient. No language interpreter was used.  Loss of Consciousness   This is a new problem. The current episode started less than 1 hour ago. The problem occurs constantly. The problem has been gradually worsening. Associated symptoms include confusion. She has tried nothing for the symptoms. The treatment provided no relief.  Pt had a syncopal episode at the bank.  EMS brought pt to ED.  Pt is reported to have passed out.  Pt states she tripped because she has glaucoma.    Past Medical History:  Diagnosis Date  . Anemia   . Anxiety   . Aortic atherosclerosis ([place])   . Arthritis   . Back pain   . Chest pain   . Chronic renal disease, stage II   . Constipation   . Dementia    frontemporal  . Depression   . Fibromyositis   . Glaucoma    right eye  . Gross hematuria   . Hair loss   . Hemorrhoids   . History of pneumothorax    left  . Horseshoe kidney   . Hyperlipidemia   . Hypertension   . Hypertensive renal disease, benign   . Malignant neoplasm of colon ([place])   . Multiple rib fractures    Left side: eibs 3 through 6  . Osteopenia   . Osteoporosis   . Personal history of DVT (deep vein thrombosis)   . Problems with hearing   . Renal cyst   . Urinary urgency   . Visual problems   . Vitamin D deficiency     Patient Active Problem List   Diagnosis Date Noted  . Carpal tunnel syndrome 12/08/2016  . Osteoporosis 10/14/2016  . Memory deficit 10/14/2016  . Diverticulosis 01/16/2012  . Hemorrhoids 01/16/2012  . HTN (hypertension) 01/16/2012    Past Surgical History:  Procedure Laterality Date  . BREAST EXCISIONAL BIOPSY Left 1993   no visible scar  . BREAST EXCISIONAL  BIOPSY Left 1992   no visible scar  . BREAST SURGERY  1970's   removed 2 benign lumps  . LUNG SURGERY     left lung punctured-surgically repaired  . TOTAL ABDOMINAL HYSTERECTOMY       OB History   None      Home Medications    Prior to Admission medications   Medication Sig Start Date End Date Taking? Authorizing Provider  acetaminophen (TYLENOL) 325 MG tablet Take 650 mg by mouth every 6 (six) hours as needed.    [provider]  amLODipine (NORVASC) 2.5 MG tablet Take 1 tablet (2.5 mg total) by mouth daily. 10/29/17   Lauree Chandler, NP  cholecalciferol (VITAMIN D) 1000 units tablet Take 1 tablet (1,000 Units total) by mouth daily. 01/30/17   Gildardo Cranker, DO  dorzolamide-timolol (COSOPT) 22.3-6.8 MG/ML ophthalmic solution INSTILL 1 DROP INTO BOTH EYES TWICE DAILY 12/13/15   [provider]  latanoprost (XALATAN) 0.005 % ophthalmic solution INSTILL 1 DROP INTO BOTH EYES AT BEDTIME 03/10/16   [provider]  losartan (COZAAR) 100 MG tablet Take 1 tablet (100 mg total) by mouth daily. 10/29/17   Lauree Chandler, NP  predniSONE (DELTASONE) 5 MG  tablet Take 1 tablet (5 mg total) by mouth daily. 01/30/17   Gildardo Cranker, DO  pregabalin (LYRICA) 50 MG capsule Take 1 capsule (50 mg total) by mouth 2 (two) times daily. 09/15/17   Lauree Chandler, NP  solifenacin (VESICARE) 5 MG tablet Take 1 tablet (5 mg total) by mouth daily. 09/15/17   Lauree Chandler, NP    Family History Family History  Problem Relation Age of Onset  . Prostate cancer Father   . Hypertension Mother        brother also  . Emphysema Mother   . Lung cancer Sister   . Colon cancer Neg Hx     Social History Social History   Tobacco Use  . Smoking status: Never Smoker  . Smokeless tobacco: Never Used  Substance Use Topics  . Alcohol use: No  . Drug use: No     Allergies   Patient has no known allergies.   Review of Systems Review of Systems  Cardiovascular:  Positive for syncope.  Psychiatric/Behavioral: Positive for confusion.  All other systems reviewed and are negative.    Physical Exam Updated Vital Signs BP (!) 186/111 (BP Location: Right Arm)   Pulse 73   Temp 98.2 F (36.8 C) (Oral)   Resp 16   SpO2 100%   Physical Exam  Constitutional: She appears well-developed and well-nourished.  HENT:  Head: Normocephalic.  Hematoma right forehead  Eyes: Pupils are equal, round, and reactive to light. Conjunctivae and EOM are normal.  Neck: Normal range of motion.  Cardiovascular: Normal rate and regular rhythm.  Pulmonary/Chest: Effort normal.  Abdominal: Soft.  Neurological: She is alert.  confused  Skin: Skin is warm.  Psychiatric: She has a normal mood and affect.  Nursing note and vitals reviewed.    ED Treatments / Results  Labs (all labs ordered are listed, but only abnormal results are displayed) Labs Reviewed  CBC WITH DIFFERENTIAL/PLATELET  COMPREHENSIVE METABOLIC PANEL    EKG None  Radiology No results found.  Procedures Procedures (including critical care time)  Medications Ordered in ED Medications - No data to display   Initial Impression / Assessment and Plan / ED Course  I have reviewed the triage vital signs and the nursing notes.  Pertinent labs & imaging results that were available during my care of the patient were reviewed by me and considered in my medical decision making (see chart for details).   Pt evaluated and examined with EMT Lovena Le in room.  Security in room.  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  Record reviewed, Pt has a history of dementia. Ct head and labs ordered.  Ct refuses lab draw.   Dr. Venora Maples in to see and examine.  RN attempting to locate family. Socail work consulted.   Final Clinical Impressions(s) / ED Diagnoses   Final diagnoses:  Contusion of forehead, initial encounter      ED Discharge Orders    None      Pt's care turned over to Dr. Laverta Baltimore.  Ct and labs pending Sidney Ace 11/09/17 1511    Fransico Meadow, PA-C 11/09/17 [place], PA-C 11/09/17 1541    Jola Schmidt, MD 11/10/17 [phone]

## 2017-11-09 NOTE — ED Notes (Signed)
Pt refused blood draw

## 2017-11-09 NOTE — ED Notes (Signed)
Pt out of bed dressed, standing at door stating she is going home; pt repeating herself; however, is awake, alert, oriented x4; concerned about her eye glasses - informed pt that there were no eye glasses with her upon her arrival - pt adament that she had glasses; additionally reports she is not going to stay for further treatment - ED MD made aware of same - in to speak with pt

## 2017-11-09 NOTE — ED Notes (Signed)
UA at bedside

## 2017-11-09 NOTE — ED Notes (Signed)
Spoke with pt's son via telephone - informed that mother was a pt in the ED; obtained pt's daughter's number; attempted to reach via telephone - got VM - no message left

## 2017-11-09 NOTE — ED Notes (Signed)
PTAR called for transport home. 

## 2017-11-10 NOTE — ED Notes (Signed)
Patient verbalizes understanding of discharge instructions. States "You don't need to read it to me, I can read. I know I'm ok. I'm going home".   Patient transported by [facility].

## 2017-11-11 ENCOUNTER — Telehealth: Payer: Self-pay

## 2017-11-11 NOTE — Telephone Encounter (Signed)
I have made the 1st attempt to contact the patient or family member in charge, in order to follow up from recently being discharged from the hospital. She answered and told me she was busy making appointments with other offices and said I needed to call back tomorrow morning instead.

## 2017-11-12 ENCOUNTER — Telehealth: Payer: Self-pay

## 2017-11-12 NOTE — Telephone Encounter (Signed)
TOC #2. Called pt to f/u after d/c from [facility] on 11/10/17. At the time of this entry, pt has not yet scheduled a hosp f/u appt. Unable to LVM d/t no VM and phone beeping loudly after ringing several times.

## 2017-11-21 ENCOUNTER — Encounter (HOSPITAL_COMMUNITY): Payer: Self-pay | Admitting: Emergency Medicine

## 2017-11-21 ENCOUNTER — Emergency Department (HOSPITAL_COMMUNITY)
Admission: EM | Admit: 2017-11-21 | Discharge: 2017-11-21 | Disposition: A | Discharge status: Home / Self Care | Payer: Medicare Other | Attending: Emergency Medicine | Admitting: Emergency Medicine

## 2017-11-21 DIAGNOSIS — Z79899 Other long term (current) drug therapy: Secondary | ICD-10-CM | POA: Insufficient documentation

## 2017-11-21 DIAGNOSIS — I129 Hypertensive chronic kidney disease with stage 1 through stage 4 chronic kidney disease, or unspecified chronic kidney disease: Secondary | ICD-10-CM | POA: Diagnosis not present

## 2017-11-21 DIAGNOSIS — S0093XA Contusion of unspecified part of head, initial encounter: Secondary | ICD-10-CM | POA: Insufficient documentation

## 2017-11-21 DIAGNOSIS — Y999 Unspecified external cause status: Secondary | ICD-10-CM | POA: Diagnosis not present

## 2017-11-21 DIAGNOSIS — Z7409 Other reduced mobility: Secondary | ICD-10-CM | POA: Insufficient documentation

## 2017-11-21 DIAGNOSIS — N182 Chronic kidney disease, stage 2 (mild): Secondary | ICD-10-CM | POA: Diagnosis not present

## 2017-11-21 DIAGNOSIS — Z9181 History of falling: Secondary | ICD-10-CM | POA: Insufficient documentation

## 2017-11-21 DIAGNOSIS — Z85038 Personal history of other malignant neoplasm of large intestine: Secondary | ICD-10-CM | POA: Diagnosis not present

## 2017-11-21 DIAGNOSIS — Y939 Activity, unspecified: Secondary | ICD-10-CM | POA: Diagnosis not present

## 2017-11-21 DIAGNOSIS — Z9189 Other specified personal risk factors, not elsewhere classified: Secondary | ICD-10-CM

## 2017-11-21 DIAGNOSIS — W19XXXA Unspecified fall, initial encounter: Secondary | ICD-10-CM | POA: Diagnosis not present

## 2017-11-21 DIAGNOSIS — Y929 Unspecified place or not applicable: Secondary | ICD-10-CM | POA: Diagnosis not present

## 2017-11-21 LAB — CBG MONITORING, ED: Glucose-Capillary: 101 mg/dL — ABNORMAL HIGH (ref 70–99)

## 2017-11-21 NOTE — ED Notes (Signed)
Pt verbalized understanding of d/c instructions and that social services and social work have a meeting on Monday to get pt into assisted living. VSS, NAD.

## 2017-11-21 NOTE — ED Notes (Signed)
Pt states that she wishes to go home. Pt is axox4. Pt spoke with this RN and the EDP about being admitted so that she can be placed in assisted living, pt wants to go to assisted living but wants to go home today and does not wish to be admitted. PTAR notified to pick pt up to take her home. Pt informed of this.

## 2017-11-21 NOTE — ED Provider Notes (Signed)
[place] EMERGENCY DEPARTMENT Provider Note   CSN: [id number] Arrival date & time: 11/21/17  1420  History   Chief Complaint Chief Complaint  Patient presents with  . Fall   HPI  Patient is a 77 year old female history of CKD and dementia presenting to the ED for evaluation after multiple falls. She was brought in at the request of Adult Protective Services agent at bedside. Per patient, her last fall was about 2 weeks ago when she suffered her forehead hematoma which is still present. She states she has not fallen since then. Today, her first meal service was being delivered a worker there was concern about her forehead swelling and contacted APS. The APS worker states that she brought the patient in because she thought that this forehead swelling was new. Patient denies any current pain. She is not sure why she has had these falls in the past and states that she suspects she gets dizzy due to her medications. She denies any chest pain, dyspnea, vomiting, or other recent illness. She denies any new injuries. Patient an APS both from the patient is working on senior resident placement.  Past Medical History:  Diagnosis Date  . Anemia   . Anxiety   . Aortic atherosclerosis ([place])   . Arthritis   . Back pain   . Chest pain   . Chronic renal disease, stage II   . Constipation   . Dementia    frontemporal  . Depression   . Fibromyositis   . Glaucoma    right eye  . Gross hematuria   . Hair loss   . Hemorrhoids   . History of pneumothorax    left  . Horseshoe kidney   . Hyperlipidemia   . Hypertension   . Hypertensive renal disease, benign   . Malignant neoplasm of colon ([place])   . Multiple rib fractures    Left side: eibs 3 through 6  . Osteopenia   . Osteoporosis   . Personal history of DVT (deep vein thrombosis)   . Problems with hearing   . Renal cyst   . Urinary urgency   . Visual problems   . Vitamin D deficiency     Patient Active Problem List    Diagnosis Date Noted  . Carpal tunnel syndrome 12/08/2016  . Osteoporosis 10/14/2016  . Memory deficit 10/14/2016  . Diverticulosis 01/16/2012  . Hemorrhoids 01/16/2012  . HTN (hypertension) 01/16/2012    Past Surgical History:  Procedure Laterality Date  . BREAST EXCISIONAL BIOPSY Left 1993   no visible scar  . BREAST EXCISIONAL BIOPSY Left 1992   no visible scar  . BREAST SURGERY  1970's   removed 2 benign lumps  . LUNG SURGERY     left lung punctured-surgically repaired  . TOTAL ABDOMINAL HYSTERECTOMY       OB History   None      Home Medications    Prior to Admission medications   Medication Sig Start Date End Date Taking? Authorizing Provider  acetaminophen (TYLENOL) 325 MG tablet Take 650 mg by mouth every 6 (six) hours as needed.    [provider]  amLODipine (NORVASC) 2.5 MG tablet Take 1 tablet (2.5 mg total) by mouth daily. 10/29/17   Lauree Chandler, NP  cholecalciferol (VITAMIN D) 1000 units tablet Take 1 tablet (1,000 Units total) by mouth daily. 01/30/17   Gildardo Cranker, DO  dorzolamide-timolol (COSOPT) 22.3-6.8 MG/ML ophthalmic solution INSTILL 1 DROP INTO BOTH EYES TWICE DAILY 12/13/15  [provider]  latanoprost (XALATAN) 0.005 % ophthalmic solution INSTILL 1 DROP INTO BOTH EYES AT BEDTIME 03/10/16   [provider]  losartan (COZAAR) 100 MG tablet Take 1 tablet (100 mg total) by mouth daily. 10/29/17   Lauree Chandler, NP  predniSONE (DELTASONE) 5 MG tablet Take 1 tablet (5 mg total) by mouth daily. 01/30/17   Gildardo Cranker, DO  pregabalin (LYRICA) 50 MG capsule Take 1 capsule (50 mg total) by mouth 2 (two) times daily. 09/15/17   Lauree Chandler, NP  solifenacin (VESICARE) 5 MG tablet Take 1 tablet (5 mg total) by mouth daily. 09/15/17   Lauree Chandler, NP    Family History Family History  Problem Relation Age of Onset  . Prostate cancer Father   . Hypertension Mother        brother also  . Emphysema Mother     . Lung cancer Sister   . Colon cancer Neg Hx     Social History Social History   Tobacco Use  . Smoking status: Never Smoker  . Smokeless tobacco: Never Used  Substance Use Topics  . Alcohol use: No  . Drug use: No     Allergies   Patient has no known allergies.   Review of Systems Review of Systems  Constitutional: Negative for fever.  HENT: Negative for congestion and sore throat.   Eyes: Negative for visual disturbance.  Respiratory: Negative for shortness of breath.   Cardiovascular: Negative for chest pain.  Gastrointestinal: Negative for abdominal pain, diarrhea and vomiting.  Genitourinary: Negative for dysuria.  Musculoskeletal: Negative for neck pain.  Neurological: Negative for syncope.  All other systems reviewed and are negative.    Physical Exam Updated Vital Signs BP (!) 148/78 (BP Location: Right Arm)   Pulse 70   Temp 97.7 F (36.5 C) (Oral)   Resp 16   SpO2 100%   Physical Exam  Constitutional: No distress.  HENT:  Head: Normocephalic.  Mouth/Throat: Oropharynx is clear and moist.  There is a 3-4 cm hematoma over the right forehead and mild right-sided periorbital ecchymosis.  Eyes: Pupils are equal, round, and reactive to light. Conjunctivae and EOM are normal.  No signs of entrapment  Neck: Neck supple. No spinous process tenderness and no muscular tenderness present. No tracheal deviation and normal range of motion present.  Cardiovascular: Normal rate, regular rhythm, normal heart sounds and intact distal pulses.  No murmur heard. Pulmonary/Chest: Effort normal and breath sounds normal. No stridor. No respiratory distress. She has no wheezes. She has no rales.  Abdominal: Soft. She exhibits no distension and no mass. There is no tenderness. There is no guarding.  Musculoskeletal: She exhibits no edema or deformity.  No tenderness or deformity along the T or L spine  Neurological: She is alert.  She is oriented to person and place. She  is disoriented to time but is otherwise able to answer my questions appropriately.  Skin: Skin is warm and dry.  Psychiatric: She has a normal mood and affect. Her behavior is normal.  Nursing note and vitals reviewed.    ED Treatments / Results  Labs (all labs ordered are listed, but only abnormal results are displayed) Labs Reviewed  CBG MONITORING, ED - Abnormal; Notable for the following components:      Result Value   Glucose-Capillary 101 (*)    All other components within normal limits    EKG EKG Interpretation  Date/Time:  Saturday November 21 2017 16:30:46 EDT  Ventricular Rate:  76 PR Interval:    QRS Duration: 92 QT Interval:  388 QTC Calculation: 437 R Axis:   21 Text Interpretation:  Sinus rhythm Low voltage, precordial leads No STEMI.  Confirmed by Nanda Quinton [phone] on 11/21/2017 4:37:41 PM   Radiology No results found.  Procedures Procedures (including critical care time)  Medications Ordered in ED Medications - No data to display   Initial Impression / Assessment and Plan / ED Course  I have reviewed the triage vital signs and the nursing notes.  Pertinent labs & imaging results that were available during my care of the patient were reviewed by me and considered in my medical decision making (see chart for details).  Patient presenting to AP has requested due to concern for acute injuries. Patient is able to clearly describe her last fall and reports that there is been no fall since her prior ED visit. CT head at that time showed no ICH, and she is not anticoagulated. Signs of forehead hematoma improved per report, do not feel repeat imaging warranted. Fingerstick normal, EKG unchanged. Falls likely due to advancing age. She is unable to describe cause of falls but doubt cardiac etiology. No signs of new injury.   I had a long discussion with APS and patient at bedside. Patient and APS are both pursuing placement in higher level of care. I tender to call  her son is who is listed as her emergency contact as well without avail. She seems to be at baseline mentation per prior documentation. There is risk with her staying at home in the interim. However, I discussed this with patient extensively bedside. Specifically discussed risk of recurrent fall, head injury, permanent disability, death. Despite her baseline disorientation, she demonstrated good insight in this discussion and declined admission. She stated that she would call someone from her church to stay with her in the meantime. Condition not change from prior visits here.   Return precautions and follow up plan reviewed. All questions answered.   Final Clinical Impressions(s) / ED Diagnoses   Final diagnoses:  At risk for impaired mobility     Prescilla Sours, MD 11/22/17 0932    Margette Fast, MD 11/22/17 1025

## 2017-11-21 NOTE — ED Triage Notes (Signed)
Patient brought in by representative from [facility] [employer], here for evaluation after multiple falls. Hematoma to right forehead. Patient alert, oriented, and in no apparent distress at this time. Patient states she wants to be evaluated quickly so she can go back home.

## 2017-11-21 NOTE — ED Notes (Signed)
Pt here with social services worker for multiple falls over the past month. Pt lives alone and has been having frequent falls and has been seen here recently for same.

## 2017-11-26 ENCOUNTER — Telehealth: Payer: Self-pay | Admitting: *Deleted

## 2017-11-26 NOTE — Telephone Encounter (Signed)
Completed and returned to [place].

## 2017-11-26 NOTE — Telephone Encounter (Signed)
FL2 Form Faxed and sent for scanning.

## 2017-11-26 NOTE — Telephone Encounter (Signed)
Kimberly Mclean with [facility] [phone] called requesting an FL2 Form to be filled out for patient for placement into a facility. Stated that patient cannot live on her on anymore.  Blank FL2 form was faxed to [place] to fill out. Place in Dr. Cyndi Lennert folder to review, fill out and sign. ([facility] Patient) To be faxed back to Fax: [phone]

## 2018-01-20 ENCOUNTER — Encounter: Payer: Self-pay | Admitting: Internal Medicine

## 2018-01-20 ENCOUNTER — Ambulatory Visit (INDEPENDENT_AMBULATORY_CARE_PROVIDER_SITE_OTHER): Payer: Medicare Other | Admitting: Internal Medicine

## 2018-01-20 VITALS — BP 148/90 | HR 75 | Temp 98.5°F | Resp 10 | Ht 61.0 in | Wt 107.0 lb

## 2018-01-20 DIAGNOSIS — R634 Abnormal weight loss: Secondary | ICD-10-CM

## 2018-01-20 DIAGNOSIS — F0390 Unspecified dementia without behavioral disturbance: Secondary | ICD-10-CM | POA: Diagnosis not present

## 2018-01-20 DIAGNOSIS — M4802 Spinal stenosis, cervical region: Secondary | ICD-10-CM

## 2018-01-20 DIAGNOSIS — R296 Repeated falls: Secondary | ICD-10-CM

## 2018-01-20 DIAGNOSIS — G5603 Carpal tunnel syndrome, bilateral upper limbs: Secondary | ICD-10-CM | POA: Diagnosis not present

## 2018-01-20 DIAGNOSIS — G8929 Other chronic pain: Secondary | ICD-10-CM

## 2018-01-20 DIAGNOSIS — M5442 Lumbago with sciatica, left side: Secondary | ICD-10-CM

## 2018-01-20 DIAGNOSIS — F03B0 Unspecified dementia, moderate, without behavioral disturbance, psychotic disturbance, mood disturbance, and anxiety: Secondary | ICD-10-CM

## 2018-01-20 DIAGNOSIS — I10 Essential (primary) hypertension: Secondary | ICD-10-CM

## 2018-01-20 MED ORDER — PREGABALIN 50 MG PO CAPS
50.0000 mg | ORAL_CAPSULE | Freq: Two times a day (BID) | ORAL | 1 refills | Status: DC
Start: 2018-01-20 — End: 2018-03-18

## 2018-01-20 MED ORDER — DONEPEZIL HCL 5 MG PO TABS: 5.0000 mg | ORAL_TABLET | Freq: Every day | ORAL | 3 refills | Status: AC

## 2018-01-20 NOTE — Patient Instructions (Signed)
START ARICEPT 5MG  AT BEDTIME FOR MEMORY LOSS  Connected care will call to discuss placement in assisted living vs skilled nursing  Continue other medications as ordered  She already had her flu shot at local pharmacy  She declined lab draw today  Follow up in 1 month with Kimberly Mclean for dementia

## 2018-01-20 NOTE — Progress Notes (Signed)
Patient ID: DOMINIKA LOSEY, female   DOB: [birth year], 77 y.o.   MRN: [id number]   Location:  [facility] OFFICE  Provider: DR Arletha Grippe  Code Status:  Goals of Care:  Advanced Directives 09/23/2017  Does Patient Have a Medical Advance Directive? No  Type of Advance Directive -  Does patient want to make changes to medical advance directive? -  Copy of [place] in Chart? -  Would patient like information on creating a medical advance directive? -     Chief Complaint  Patient presents with  . Medical Management of Chronic Issues    4 month follow-up, + fall risk, audit c screening (neg).  . Medication Refill    Refill Lyrica, mailorder   . Immunizations    Patient states she already had at pharmacy, could not recall where   . FYI    Per front desk observation patient called 3-4 times weekly to verify appointment. Patient appears disoriented at times     HPI: Patient is a 77 y.o. female seen today for medical management of chronic diseases.  She has been struggling with her memory - calls frequently office to see what day it is. She took a cab to the office today. She has trouble with her vision and is followed by eye specialist.  OAB - stable on vesicare  CTS - b/l hands. She has difficulty gripping objects due to throbbing and numbness. 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 Uses eye gtts in [place]. Followed by Dr Gershon Crane  External hemorrhoids - she has had banding for 3rd degree hemorrhoids in past without relief. She states they are very uncomfortable and she would like them "fixed".  Osteopenia - stable. Takes vit D3. Last DXA in April 2018  Joint pain - stable on meloxicam  Moderate dementia - wt down 4 lbs since May 2019 (total 8 lbs in last yr). She states she is eating at least 3 meals per day. She does not take any meds for cognition. MMSE 17/30. Albumin 4.3   Past Medical  History:  Diagnosis Date  . Anemia   . Anxiety   . Aortic atherosclerosis ([place])   . Arthritis   . Back pain   . Chest pain   . Chronic renal disease, stage II   . Constipation   . Dementia    frontemporal  . Depression   . Fibromyositis   . Glaucoma    right eye  . Gross hematuria   . Hair loss   . Hemorrhoids   . History of pneumothorax    left  . Horseshoe kidney   . Hyperlipidemia   . Hypertension   . Hypertensive renal disease, benign   . Malignant neoplasm of colon ([place])   . Multiple rib fractures    Left side: eibs 3 through 6  . Osteopenia   . Osteoporosis   . Personal history of DVT (deep vein thrombosis)   . Problems with hearing   . Renal cyst   . Urinary urgency   . Visual problems   . Vitamin D deficiency     Past Surgical History:  Procedure Laterality Date  . BREAST EXCISIONAL BIOPSY Left 1993   no visible scar  . BREAST EXCISIONAL BIOPSY Left 1992   no visible scar  . BREAST SURGERY  1970's   removed 2 benign lumps  . LUNG SURGERY  left lung punctured-surgically repaired  . TOTAL ABDOMINAL HYSTERECTOMY       reports that she has never smoked. She has never used smokeless tobacco. She reports that she does not drink alcohol or use drugs. Social History   Socioeconomic History  . Marital status: Single    Spouse name: Not on file  . Number of children: Not on file  . Years of education: Not on file  . Highest education level: Not on file  Occupational History  . Not on file  Social Needs  . Financial resource strain: Not hard at all  . Food insecurity:    Worry: Never true    Inability: Never true  . Transportation needs:    Medical: No    Non-medical: No  Tobacco Use  . Smoking status: Never Smoker  . Smokeless tobacco: Never Used  Substance and Sexual Activity  . Alcohol use: No  . Drug use: No  . Sexual activity: Not Currently  Lifestyle  . Physical activity:    Days per week: 0 days    Minutes per session: 0 min  .  Stress: Rather much  Relationships  . Social connections:    Talks on phone: Once a week    Gets together: Once a week    Attends religious service: More than 4 times per year    Active member of club or organization: No    Attends meetings of clubs or organizations: Never    Relationship status: Never married  . Intimate partner violence:    Fear of current or ex partner: No    Emotionally abused: No    Physically abused: No    Forced sexual activity: No  Other Topics Concern  . Not on file  Social History Narrative   Social History      Diet? no      Do you drink/eat things with caffeine? yes      Marital status?                                    What year were you married?      Do you live in a house, apartment, assisted living, condo, trailer, etc.? house      Is it one or more stories? 1 story      How many persons live in your home? none      Do you have any pets in your home? (please list) no      Highest level of education completed? 12 grade      Current or past profession: [employer]. [employer]      Advanced Directives      Do you exercise?      Some time                                Type & how often? Leg and arm raises 2 x week      Do you have a living will? no      Do you have a DNR form?   no                               If not, do you want to discuss one? no      Do you have signed POA/HPOA  for forms?       Functional Status      Do you have difficulty bathing or dressing yourself? no      Do you have difficulty preparing food or eating? no      Do you have difficulty managing your medications? no      Do you have difficulty managing your finances? no      Do you have difficulty affording your medications? no    Family History  Problem Relation Age of Onset  . Prostate cancer Father   . Hypertension Mother        brother also  . Emphysema Mother   . Lung cancer Sister   . Colon cancer Neg Hx     No  Known Allergies  Outpatient Encounter Medications as of 01/20/2018  Medication Sig  . acetaminophen (TYLENOL) 325 MG tablet Take 650 mg by mouth every 6 (six) hours as needed.  Marland Kitchen amLODipine (NORVASC) 2.5 MG tablet Take 1 tablet (2.5 mg total) by mouth daily.  . cholecalciferol (VITAMIN D) 1000 units tablet Take 1 tablet (1,000 Units total) by mouth daily.  . dorzolamide-timolol (COSOPT) 22.3-6.8 MG/ML ophthalmic solution INSTILL 1 DROP INTO BOTH EYES TWICE DAILY  . latanoprost (XALATAN) 0.005 % ophthalmic solution INSTILL 1 DROP INTO BOTH EYES AT BEDTIME  . losartan (COZAAR) 100 MG tablet Take 1 tablet (100 mg total) by mouth daily.  . predniSONE (DELTASONE) 5 MG tablet Take 1 tablet (5 mg total) by mouth daily.  . solifenacin (VESICARE) 5 MG tablet Take 1 tablet (5 mg total) by mouth daily.  . pregabalin (LYRICA) 50 MG capsule Take 1 capsule (50 mg total) by mouth 2 (two) times daily. (Patient not taking: Reported on 01/20/2018)   No facility-administered encounter medications on file as of 01/20/2018.     Review of Systems:  Review of Systems  Unable to perform ROS: Dementia    Health Maintenance  Topic Date Due  . INFLUENZA VACCINE  12/03/2017  . PNA vac Low Risk Adult (1 of 2 - PCV13) 06/24/2018 (Originally 06/04/2005)  . TETANUS/TDAP  09/18/2027  . DEXA SCAN  Completed    Physical Exam: Vitals:   01/20/18 1137  BP: (!) 148/90  Pulse: 75  Resp: 10  Temp: 98.5 F (36.9 C)  TempSrc: Oral  SpO2: 95%  Weight: 107 lb (48.5 kg)  Height: 5\' 1"  (1.549 m)   Body mass index is 20.22 kg/m. Physical Exam  Constitutional: She appears well-developed and well-nourished.  HENT:  Mouth/Throat: Oropharynx is clear and moist. No oropharyngeal exudate.  MMM; no oral thrush  Eyes: Pupils are equal, round, and reactive to light. No scleral icterus.  OD lens opacification  Neck: Neck supple. Muscular tenderness present. Carotid bruit is not present. Decreased range of motion present. No  tracheal deviation present. No thyromegaly present.  Cardiovascular: Normal rate, regular rhythm and intact distal pulses. Exam reveals no gallop and no friction rub.  Murmur (1/6 SEM) heard. Trace LE edema b/l. No calf TTP  Pulmonary/Chest: Effort normal and breath sounds normal. No stridor. No respiratory distress. She has no wheezes. She has no rales.  Abdominal: Soft. Normal appearance and bowel sounds are normal. She exhibits no distension and no mass. There is no hepatomegaly. There is no tenderness. There is no rigidity, no rebound and no guarding. No hernia.  Musculoskeletal: She exhibits edema (small and large joints) and deformity (neck flexion contracture).  Neg Tinel's b/l  Lymphadenopathy:    She has no  cervical adenopathy.  Neurological: She is alert. She has normal reflexes. Gait (unsteady; uses 4 prong cane) abnormal.  Skin: Skin is warm and dry. No rash noted.  Psychiatric: She has a normal mood and affect. Her behavior is normal. Her speech is tangential. Cognition and memory are impaired.    Labs reviewed: Basic Metabolic Panel: Recent Labs    01/30/17 1248 09/15/17 1203  NA 142 142  K 4.2 4.3  CL 109 106  CO2 26 29  GLUCOSE 88 98  BUN 17 22  CREATININE 0.94* 0.95*  CALCIUM 9.2 9.7   Liver Function Tests: Recent Labs    01/30/17 1248 09/15/17 1203  AST 26 19  ALT 15 16  BILITOT 0.8 0.5  PROT 6.6 6.7   No results for input(s): LIPASE, AMYLASE in the last 8760 hours. No results for input(s): AMMONIA in the last 8760 hours. CBC: Recent Labs    09/15/17 1203  WBC 5.7  NEUTROABS 4,178  HGB 13.4  HCT 41.1  MCV 86.7  PLT 264   Lipid Panel: No results for input(s): CHOL, HDL, LDLCALC, TRIG, CHOLHDL, LDLDIRECT in the last 8760 hours. Lab Results  Component Value Date   HGBA1C 5.3 11/17/2014    Procedures since last visit: No results found.  Assessment/Plan   ICD-10-CM   1. Moderate dementia without behavioral disturbance F03.90 Ambulatory  referral to Connected Care    donepezil (ARICEPT) 5 MG tablet  2. Recurrent falls R29.6 Ambulatory referral to Connected Care  3. Weight loss, unintentional R63.4 Ambulatory referral to Connected Care  4. Bilateral carpal tunnel syndrome G56.03 pregabalin (LYRICA) 50 MG capsule  5. Essential hypertension I10   6. Spinal stenosis of cervical region M48.02   7. Chronic bilateral low back pain with left-sided sciatica M54.42    G89.29    START ARICEPT 5MG  AT BEDTIME FOR MEMORY LOSS  Connected care will call to discuss placement in assisted living vs skilled nursing  Continue other medications as ordered  She already had her flu shot at local pharmacy  She declined lab draw today  Follow up in 1 month with Janett Billow for dementia   Osias Resnick S. Perlie Gold  [facility] [address] [place], [place] [address] [phone] Cell (Monday-Friday 8 AM - 5 PM) [phone] After 5 PM and follow prompts

## 2018-02-02 ENCOUNTER — Telehealth: Payer: Self-pay

## 2018-02-02 NOTE — Telephone Encounter (Signed)
Patient called to say she is out Lyrica and the pharmacy will not send her rx after she paid them 140 + dollars. Patient states she is having hand burning and pain. Patient requested appointment to see her pcp Lauree Chandler, NP.  I scheduled appointment for patient to see [facility] tomorrow.  I called [facility] to check the status of rx. Patient recently paid a balance over the weekend. RX will be mailed out within the next 24-48 hours.  Patient aware of interaction with [facility]

## 2018-02-03 ENCOUNTER — Encounter: Payer: Self-pay | Admitting: Nurse Practitioner

## 2018-02-03 ENCOUNTER — Telehealth: Payer: Self-pay

## 2018-02-03 ENCOUNTER — Ambulatory Visit (INDEPENDENT_AMBULATORY_CARE_PROVIDER_SITE_OTHER): Payer: Medicare Other | Admitting: Nurse Practitioner

## 2018-02-03 VITALS — BP 144/86 | HR 78 | Temp 97.9°F | Ht 61.0 in | Wt 104.2 lb

## 2018-02-03 DIAGNOSIS — R634 Abnormal weight loss: Secondary | ICD-10-CM | POA: Diagnosis not present

## 2018-02-03 DIAGNOSIS — R296 Repeated falls: Secondary | ICD-10-CM | POA: Diagnosis not present

## 2018-02-03 DIAGNOSIS — F0390 Unspecified dementia without behavioral disturbance: Secondary | ICD-10-CM | POA: Diagnosis not present

## 2018-02-03 DIAGNOSIS — F03B0 Unspecified dementia, moderate, without behavioral disturbance, psychotic disturbance, mood disturbance, and anxiety: Secondary | ICD-10-CM

## 2018-02-03 DIAGNOSIS — G5603 Carpal tunnel syndrome, bilateral upper limbs: Secondary | ICD-10-CM | POA: Diagnosis not present

## 2018-02-03 NOTE — Progress Notes (Signed)
Careteam: Patient Care Team: Lauree Chandler, NP as PCP - General (Geriatric Medicine)  Advanced Directive information Does Patient Have a Medical Advance Directive?: No  No Known Allergies  Chief Complaint  Patient presents with  . Acute Visit    Pt is being seen due to ongoing buring pain in both hands.   . Audit C Screening    score of 0     HPI: Patient is a 77 y.o. female seen in the office today due to worsening pain and numbness in her hands. Pt with hx of dementia, carpel tunnel syndrome on lyrica however states she never got her lyrica which was called in.   Pharmacy was called and apparently pt owed a balance and that was why the medication was not send out. This balance was paid and the lyrcia was sent out to her house.  Pt with dementia and living alone. She reports she does have a SW that is working with her and she plans to move somewhere (unsure where) that will help her with her personal care and medication.  She is unsure what medications she is taking at this time.  Reports she does not eat and sometimes falls asleep in her food. Pt with hx of multiple falls.  We called SW but apparently pt declined services so case was closed. Pt has stated in the past she does not have an ongoing relationship with her sons.   Review of Systems:  Review of Systems  Unable to perform ROS: Dementia    Past Medical History:  Diagnosis Date  . Anemia   . Anxiety   . Aortic atherosclerosis ([place])   . Arthritis   . Back pain   . Chest pain   . Chronic renal disease, stage II   . Constipation   . Dementia ([place])    frontemporal  . Depression   . Fibromyositis   . Glaucoma    right eye  . Gross hematuria   . Hair loss   . Hemorrhoids   . History of pneumothorax    left  . Horseshoe kidney   . Hyperlipidemia   . Hypertension   . Hypertensive renal disease, benign   . Malignant neoplasm of colon ([place])   . Multiple rib fractures    Left side: eibs 3 through 6    . Osteopenia   . Osteoporosis   . Personal history of DVT (deep vein thrombosis)   . Problems with hearing   . Renal cyst   . Urinary urgency   . Visual problems   . Vitamin D deficiency    Past Surgical History:  Procedure Laterality Date  . BREAST EXCISIONAL BIOPSY Left 1993   no visible scar  . BREAST EXCISIONAL BIOPSY Left 1992   no visible scar  . BREAST SURGERY  1970's   removed 2 benign lumps  . LUNG SURGERY     left lung punctured-surgically repaired  . TOTAL ABDOMINAL HYSTERECTOMY     Social History:   reports that she has never smoked. She has never used smokeless tobacco. She reports that she does not drink alcohol or use drugs.  Family History  Problem Relation Age of Onset  . Prostate cancer Father   . Hypertension Mother        brother also  . Emphysema Mother   . Lung cancer Sister   . Colon cancer Neg Hx     Medications: Patient's Medications  New Prescriptions   No medications on  file  Previous Medications   ACETAMINOPHEN (TYLENOL) 325 MG TABLET    Take 650 mg by mouth every 6 (six) hours as needed.   AMLODIPINE (NORVASC) 2.5 MG TABLET    Take 1 tablet (2.5 mg total) by mouth daily.   CHOLECALCIFEROL (VITAMIN D) 1000 UNITS TABLET    Take 1 tablet (1,000 Units total) by mouth daily.   DONEPEZIL (ARICEPT) 5 MG TABLET    Take 1 tablet (5 mg total) by mouth at bedtime. For memory loss   DORZOLAMIDE-TIMOLOL (COSOPT) 22.3-6.8 MG/ML OPHTHALMIC SOLUTION    INSTILL 1 DROP INTO BOTH EYES TWICE DAILY   LATANOPROST (XALATAN) 0.005 % OPHTHALMIC SOLUTION    INSTILL 1 DROP INTO BOTH EYES AT BEDTIME   LOSARTAN (COZAAR) 100 MG TABLET    Take 1 tablet (100 mg total) by mouth daily.   PREDNISONE (DELTASONE) 5 MG TABLET    Take 1 tablet (5 mg total) by mouth daily.   PREGABALIN (LYRICA) 50 MG CAPSULE    Take 1 capsule (50 mg total) by mouth 2 (two) times daily.   SOLIFENACIN (VESICARE) 5 MG TABLET    Take 1 tablet (5 mg total) by mouth daily.  Modified Medications    No medications on file  Discontinued Medications   No medications on file     Physical Exam:  Vitals:   02/03/18 1109  BP: (!) 144/86  Pulse: 78  Temp: 97.9 F (36.6 C)  TempSrc: Oral  SpO2: 98%  Weight: 104 lb 3.2 oz (47.3 kg)  Height: 5\' 1"  (1.549 m)   Body mass index is 19.69 kg/m.  Physical Exam  Constitutional: She appears well-developed and well-nourished.  HENT:  Mouth/Throat: Oropharynx is clear and moist. No oropharyngeal exudate.  Eyes: Pupils are equal, round, and reactive to light. No scleral icterus.  Neck: Neck supple. Muscular tenderness present. Carotid bruit is not present. Decreased range of motion present. No tracheal deviation present.  Cardiovascular: Normal rate, regular rhythm and intact distal pulses. Exam reveals no gallop and no friction rub.  Murmur (1/6 SEM) heard. Trace LE edema b/l. No calf TTP  Pulmonary/Chest: Effort normal and breath sounds normal. No stridor. No respiratory distress. She has no wheezes. She has no rales.  Abdominal: Soft. Normal appearance and bowel sounds are normal. There is no hepatomegaly. There is no rigidity and no rebound.  Musculoskeletal: She exhibits edema (small and large joints) and deformity (neck flexion contracture).  Kyphotic posture.   Neurological: She is alert. She has normal reflexes. Gait (unsteady; uses 4 prong cane) abnormal.  Skin: Skin is warm and dry. No rash noted.  Psychiatric: She has a normal mood and affect. Her behavior is normal. Her speech is tangential. Cognition and memory are impaired.   Labs reviewed: Basic Metabolic Panel: Recent Labs    09/15/17 1203  NA 142  K 4.3  CL 106  CO2 29  GLUCOSE 98  BUN 22  CREATININE 0.95*  CALCIUM 9.7   Liver Function Tests: Recent Labs    09/15/17 1203  AST 19  ALT 16  BILITOT 0.5  PROT 6.7   No results for input(s): LIPASE, AMYLASE in the last 8760 hours. No results for input(s): AMMONIA in the last 8760 hours. CBC: Recent Labs     09/15/17 1203  WBC 5.7  NEUTROABS 4,178  HGB 13.4  HCT 41.1  MCV 86.7  PLT 264   Lipid Panel: No results for input(s): CHOL, HDL, LDLCALC, TRIG, CHOLHDL, LDLDIRECT in the last 8760  hours. TSH: No results for input(s): TSH in the last 8760 hours. A1C: Lab Results  Component Value Date   HGBA1C 5.3 11/17/2014     Assessment/Plan 1. Moderate dementia without behavioral disturbance ([place]) Pt forgot her medication today, unsure what she is actually taking. aricept recently started due to memory loss. CT of head in July Chronic microvascular changes. Atrophy. Ventricular prominence may be related to atrophy rather than hydrocephalus and is without change. Most recent lab work unremarkable. Pt currently living alone and took a cab to [place]. DSS was contacted since it appears that they have been involved in the past and apparently had SW working with her at one time. Case reopened because we are very concerned over living situation. Pt even states she does not feel like she can live alone and she was under the impression during the office visit that she was moving to a AL/SNF? however I do not feel like this is actually the case.   2. Recurrent falls Needing increase assistance in the home. Pt reports she is working with "someone" but when tried to get in touch with SW the case was closed.   3. Weight loss, unintentional Ongoing weight loss, pt with dementia unsure if she is actually getting the proper meals she should get.   4. Bilateral carpal tunnel syndrome Worsening pain because she is not taking medication. Went over medication but was very unsure on how she was supposed to be taking it.   Total time over 45 mins:  time greater than 50% of total time spent doing pt counseling and coordination of care with pt and reaching out for pt support.  Carlos American. [place], [facility] [phone]

## 2018-02-03 NOTE — Telephone Encounter (Signed)
I spoke with Kimberly Mclean at [facility] regarding the [facility] that was initiated by her in July. She stated that placement at a facility must be on a voluntary basis and patient refused placement or services. [name] closed the case.   [name] stated that if provider has additional concerns regarding patient's safety and memory decline, she suggests that provider contact adult protective services with new concerns. The number to APS intake line is [phone].

## 2018-02-08 NOTE — Telephone Encounter (Signed)
APS called and information provided, they plan on looking into Kimberly Mclean's situation and will be in touch.

## 2018-02-08 NOTE — Telephone Encounter (Signed)
[place] with Adult Protective Services called to ask if there was any more information that we could share about patient's situations. Kimberly Mclean was informed that patient seemed disoriented while at her appointment on 02/03/18. She was confused about how to take her medications, she had lost weight, pt was unaware that she was missing a lens of her glasses. After the appointment, patient was unsure of how she was to get home from appointment. Office staff informed patient that she had arrived in a taxi and we would call the taxi to pick her up. She then handed me her cordless house phone to call the taxi. Patient was unsure of which taxi company had brought her so the office staff called several companies to see if we could determine which company had dropped her office. Once that was determined, the taxi company picked patient up from out office.

## 2018-02-24 ENCOUNTER — Ambulatory Visit: Payer: Medicare Other | Admitting: Nurse Practitioner

## 2018-03-01 ENCOUNTER — Telehealth: Payer: Self-pay

## 2018-03-01 NOTE — Telephone Encounter (Signed)
Noted  

## 2018-03-01 NOTE — Telephone Encounter (Signed)
Patient called to schedule an appointment for next week due to burning and pain in her hands. She stated that she is out of the "little white pills for her hands, the one that starts with a Lyr." Patient is currently prescribed Lyrica and was given samples at her last visit. A prescription was also sent to [facility] for a 90 day supply but patient stated that she never received the medication. According to phone note dated 02/02/18 with [facility], the medication was going to be mailed within 24 to 48 hours of that date.   Patient became confused during our phone conversation and kept saying that we needed to give her her eye drops. I asked patient which medication she was out of and she kept going back and forth between the lyrica and eye drops.   I tried to get patient scheduled for an appointment for in the next few days due to confusion but patient declined. She scheduled an appointment for 03/08/18.

## 2018-03-08 ENCOUNTER — Ambulatory Visit: Payer: Self-pay | Admitting: Nurse Practitioner

## 2018-03-12 ENCOUNTER — Observation Stay (HOSPITAL_COMMUNITY)
Admission: EM | Admit: 2018-03-12 | Discharge: 2018-03-18 | Disposition: A | Discharge status: Skilled Nursing Facility | Payer: Medicare Other | Attending: Internal Medicine | Admitting: Internal Medicine

## 2018-03-12 ENCOUNTER — Emergency Department (HOSPITAL_COMMUNITY): Payer: Medicare Other

## 2018-03-12 ENCOUNTER — Encounter (HOSPITAL_COMMUNITY): Payer: Self-pay | Admitting: Emergency Medicine

## 2018-03-12 DIAGNOSIS — M6282 Rhabdomyolysis: Secondary | ICD-10-CM | POA: Diagnosis not present

## 2018-03-12 DIAGNOSIS — F419 Anxiety disorder, unspecified: Secondary | ICD-10-CM | POA: Insufficient documentation

## 2018-03-12 DIAGNOSIS — M797 Fibromyalgia: Secondary | ICD-10-CM | POA: Insufficient documentation

## 2018-03-12 DIAGNOSIS — E870 Hyperosmolality and hypernatremia: Secondary | ICD-10-CM | POA: Diagnosis not present

## 2018-03-12 DIAGNOSIS — E871 Hypo-osmolality and hyponatremia: Secondary | ICD-10-CM | POA: Diagnosis not present

## 2018-03-12 DIAGNOSIS — I129 Hypertensive chronic kidney disease with stage 1 through stage 4 chronic kidney disease, or unspecified chronic kidney disease: Secondary | ICD-10-CM | POA: Diagnosis not present

## 2018-03-12 DIAGNOSIS — E785 Hyperlipidemia, unspecified: Secondary | ICD-10-CM | POA: Diagnosis not present

## 2018-03-12 DIAGNOSIS — L89153 Pressure ulcer of sacral region, stage 3: Secondary | ICD-10-CM | POA: Insufficient documentation

## 2018-03-12 DIAGNOSIS — Z7952 Long term (current) use of systemic steroids: Secondary | ICD-10-CM | POA: Insufficient documentation

## 2018-03-12 DIAGNOSIS — H409 Unspecified glaucoma: Secondary | ICD-10-CM | POA: Insufficient documentation

## 2018-03-12 DIAGNOSIS — W19XXXA Unspecified fall, initial encounter: Secondary | ICD-10-CM | POA: Insufficient documentation

## 2018-03-12 DIAGNOSIS — I700 Atherosclerosis of aorta: Secondary | ICD-10-CM | POA: Insufficient documentation

## 2018-03-12 DIAGNOSIS — R7989 Other specified abnormal findings of blood chemistry: Secondary | ICD-10-CM | POA: Insufficient documentation

## 2018-03-12 DIAGNOSIS — M810 Age-related osteoporosis without current pathological fracture: Secondary | ICD-10-CM | POA: Diagnosis not present

## 2018-03-12 DIAGNOSIS — R413 Other amnesia: Secondary | ICD-10-CM | POA: Diagnosis present

## 2018-03-12 DIAGNOSIS — N182 Chronic kidney disease, stage 2 (mild): Secondary | ICD-10-CM | POA: Diagnosis not present

## 2018-03-12 DIAGNOSIS — R296 Repeated falls: Secondary | ICD-10-CM | POA: Insufficient documentation

## 2018-03-12 DIAGNOSIS — B954 Other streptococcus as the cause of diseases classified elsewhere: Secondary | ICD-10-CM | POA: Insufficient documentation

## 2018-03-12 DIAGNOSIS — F0281 Dementia in other diseases classified elsewhere with behavioral disturbance: Secondary | ICD-10-CM | POA: Diagnosis not present

## 2018-03-12 DIAGNOSIS — R17 Unspecified jaundice: Secondary | ICD-10-CM

## 2018-03-12 DIAGNOSIS — N390 Urinary tract infection, site not specified: Secondary | ICD-10-CM | POA: Insufficient documentation

## 2018-03-12 DIAGNOSIS — F03918 Unspecified dementia, unspecified severity, with other behavioral disturbance: Secondary | ICD-10-CM

## 2018-03-12 DIAGNOSIS — M1990 Unspecified osteoarthritis, unspecified site: Secondary | ICD-10-CM | POA: Diagnosis not present

## 2018-03-12 DIAGNOSIS — R945 Abnormal results of liver function studies: Secondary | ICD-10-CM

## 2018-03-12 DIAGNOSIS — T796XXA Traumatic ischemia of muscle, initial encounter: Secondary | ICD-10-CM

## 2018-03-12 DIAGNOSIS — E860 Dehydration: Secondary | ICD-10-CM | POA: Diagnosis present

## 2018-03-12 DIAGNOSIS — R740 Nonspecific elevation of levels of transaminase and lactic acid dehydrogenase [LDH]: Secondary | ICD-10-CM

## 2018-03-12 DIAGNOSIS — R7401 Elevation of levels of liver transaminase levels: Secondary | ICD-10-CM

## 2018-03-12 DIAGNOSIS — J320 Chronic maxillary sinusitis: Secondary | ICD-10-CM | POA: Insufficient documentation

## 2018-03-12 DIAGNOSIS — I10 Essential (primary) hypertension: Secondary | ICD-10-CM | POA: Diagnosis present

## 2018-03-12 DIAGNOSIS — F329 Major depressive disorder, single episode, unspecified: Secondary | ICD-10-CM | POA: Insufficient documentation

## 2018-03-12 DIAGNOSIS — Z86718 Personal history of other venous thrombosis and embolism: Secondary | ICD-10-CM | POA: Insufficient documentation

## 2018-03-12 DIAGNOSIS — L8990 Pressure ulcer of unspecified site, unspecified stage: Secondary | ICD-10-CM

## 2018-03-12 DIAGNOSIS — F0391 Unspecified dementia with behavioral disturbance: Secondary | ICD-10-CM

## 2018-03-12 LAB — URINALYSIS, ROUTINE W REFLEX MICROSCOPIC
Bacteria, UA: NONE SEEN
Bilirubin Urine: NEGATIVE
GLUCOSE, UA: NEGATIVE mg/dL
Ketones, ur: 5 mg/dL — AB
NITRITE: NEGATIVE
PH: 6 (ref 5.0–8.0)
Protein, ur: 100 mg/dL — AB
SPECIFIC GRAVITY, URINE: 1.023 (ref 1.005–1.030)

## 2018-03-12 LAB — CBC WITH DIFFERENTIAL/PLATELET
Abs Immature Granulocytes: 0.04 10*3/uL (ref 0.00–0.07)
BASOS ABS: 0 10*3/uL (ref 0.0–0.1)
Basophils Relative: 0 %
EOS ABS: 0 10*3/uL (ref 0.0–0.5)
Eosinophils Relative: 0 %
HEMATOCRIT: 47.1 % — AB (ref 36.0–46.0)
HEMOGLOBIN: 14.5 g/dL (ref 12.0–15.0)
Immature Granulocytes: 1 %
LYMPHS ABS: 1.1 10*3/uL (ref 0.7–4.0)
LYMPHS PCT: 13 %
MCH: 27.6 pg (ref 26.0–34.0)
MCHC: 30.8 g/dL (ref 30.0–36.0)
MCV: 89.5 fL (ref 80.0–100.0)
MONOS PCT: 12 %
Monocytes Absolute: 1 10*3/uL (ref 0.1–1.0)
NRBC: 0 % (ref 0.0–0.2)
Neutro Abs: 5.9 10*3/uL (ref 1.7–7.7)
Neutrophils Relative %: 74 %
Platelets: 318 10*3/uL (ref 150–400)
RBC: 5.26 MIL/uL — ABNORMAL HIGH (ref 3.87–5.11)
RDW: 12.2 % (ref 11.5–15.5)
WBC: 8 10*3/uL (ref 4.0–10.5)

## 2018-03-12 LAB — CK
CK TOTAL: 2126 U/L — AB (ref 38–234)
Total CK: 2070 U/L — ABNORMAL HIGH (ref 38–234)

## 2018-03-12 LAB — COMPREHENSIVE METABOLIC PANEL
ALBUMIN: 4.1 g/dL (ref 3.5–5.0)
ALK PHOS: 58 U/L (ref 38–126)
ALT: 56 U/L — AB (ref 0–44)
ANION GAP: 9 (ref 5–15)
AST: 80 U/L — AB (ref 15–41)
BILIRUBIN TOTAL: 1.5 mg/dL — AB (ref 0.3–1.2)
BUN: 29 mg/dL — AB (ref 8–23)
CO2: 31 mmol/L (ref 22–32)
Calcium: 9.9 mg/dL (ref 8.9–10.3)
Chloride: 106 mmol/L (ref 98–111)
Creatinine, Ser: 0.86 mg/dL (ref 0.44–1.00)
GFR calc Af Amer: >60 mL/min (ref >60)
GFR calc non Af Amer: >60 mL/min (ref >60)
GLUCOSE: 162 mg/dL — AB (ref 70–99)
Potassium: 3.9 mmol/L (ref 3.5–5.1)
SODIUM: 146 mmol/L — AB (ref 135–145)
TOTAL PROTEIN: 7.3 g/dL (ref 6.5–8.1)

## 2018-03-12 LAB — I-STAT CG4 LACTIC ACID, ED: Lactic Acid, Venous: 2.92 mmol/L (ref 0.5–1.9)

## 2018-03-12 LAB — ETHANOL: Alcohol, Ethyl (B): <10 mg/dL (ref <10)

## 2018-03-12 MED ORDER — SODIUM CHLORIDE 0.9 % IV BOLUS
1000.0000 mL | Freq: Once | INTRAVENOUS | Status: AC
  Administered 2018-03-12: 1000 mL via INTRAVENOUS

## 2018-03-12 NOTE — ED Notes (Signed)
I gave critical I Stat CG4 results to MD Little

## 2018-03-12 NOTE — Progress Notes (Addendum)
CSW met with pt and pt did not know where she was.  Pt stated she was in [place] within [facility] and has lived there for "either 7 years or 7 months".  Pt was able to give her address initially (only the street address and street name and not the city) and then could not remember it 5 minutes later as the conversation progressed.  Pt stated she did not know who brought her "here".  Pt stated she lives with "Reggie", but could not state who that was other than stating "he works here in [employer], don't you know him?, but could".  Pt is only oriented to self at this time.    9:25 PM CSW documented the above and then returned to the pt's room and the [place] asked pt if the pt could provide verbal permission for the CSW to call the pt's relative Persia Lintner at ph: [phone] in the presence of the tech and the staff member operating the utlrasound and the pt emphatically refused saying, "If you want to know anything about my business you ask me, if Enid Derry wants to know anything about my business she can ask me, you are not allowed to talk to her, do you hear me?"    This process repeated itself two times and CSW again voiced understanding.  CSW then stated her [place] called and provided her number and pt stated she "can answer any questions" the CSW has and the CSW is not allowed to call the pt's relative for any reason.  CSW voiced understanding and told the pt if she would like to call her relative she could ask for a phone if needed.  Pt stated she can take care of herself.    9:30 PM CSW returned to room and pt was seated on the floor in her room beside her bed and was being attended to by two RN's and the person who was there to perform the ultrasound.    11:10 PM  EPD updated.  CSW will continue to follow for D/C needs.  Alphonse Guild. Tashaun Obey, LCSW, LCAS, CSI Clinical Social Worker Ph: [phone]

## 2018-03-12 NOTE — ED Notes (Signed)
Pt given soup, crackers, graham crackers, and ginger ale.

## 2018-03-12 NOTE — ED Triage Notes (Signed)
Patient arrived by EMS from home. Pt is placed on IVC. Pt was found on the floor three hours ago. Pt didn't want to be evaluated by EMS. Pt family member took out IVC papers on pt.  EMS stated pt was able to ambulate out of her home. Hx of HTN.   T 100.6, BP 148/78, CBG 122, HR 90, RR 20 . EMS gave 840 mg of tylenol to pt.

## 2018-03-12 NOTE — BH Assessment (Addendum)
Assessment Note  Kimberly Mclean is an 77 y.o. female who presents to the ED under IVC initiated by her sister-in-law. Per IVC, respondent "has dementia; fell and was found lying on the floor, will not let anyone near hear, swings cane at anyone who comes near." Pt is disoriented and labile during the assessment. Pt asks this Probation officer why she is being asked questions. Pt states she is not in the hospital and states she is currently at home in the kitchen. Pt states "you said I want to kill myself, why would I want to do that. Who told you I want to do that?" Pt spoke with CSW and presented with confusion and disorientation during CSW assessment as well per chart review. Pt denies prior psych hx.   Per Patriciaann Clan, PA pt is psych cleared and recommends SW follow up in order to assist with possibly placement in memory care or assisted living facility. EDP Little, Wenda Overland, MD and pt's nurse Luz Lex, RN have been advised.  Diagnosis: Mild neurocognitive disorder due to another medical condition  Past Medical History:  Past Medical History:  Diagnosis Date  . Anemia   . Anxiety   . Aortic atherosclerosis ([place])   . Arthritis   . Back pain   . Chest pain   . Chronic renal disease, stage II   . Constipation   . 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 Depression   . Fibromyositis   . Glaucoma    right eye  . Gross hematuria   . Hair loss   . Hemorrhoids   . History of pneumothorax    left  . Horseshoe kidney   . Hyperlipidemia   . Hypertension   . Hypertensive renal disease, benign   . Malignant neoplasm of colon ([place])   . Multiple rib fractures    Left side: eibs 3 through 6  . Osteopenia   . Osteoporosis   . Personal history of DVT (deep vein thrombosis)   . Problems with hearing   . Renal cyst   . Urinary urgency   . Visual problems   . Vitamin D deficiency     Past Surgical History:  Procedure Laterality Date  . BREAST EXCISIONAL BIOPSY Left 1993   no visible scar  .  BREAST EXCISIONAL BIOPSY Left 1992   no visible scar  . BREAST SURGERY  1970's   removed 2 benign lumps  . LUNG SURGERY     left lung punctured-surgically repaired  . TOTAL ABDOMINAL HYSTERECTOMY      Family History:  Family History  Problem Relation Age of Onset  . Prostate cancer Father   . Hypertension Mother        brother also  . Emphysema Mother   . Lung cancer Sister   . Colon cancer Neg Hx     Social History:  reports that she has never smoked. She has never used smokeless tobacco. She reports that she does not drink alcohol or use drugs.  Additional Social History:  Alcohol / Drug Use Pain Medications: See MAR Prescriptions: See MAR Over the Counter: See MAR History of alcohol / drug use?: No history of alcohol / drug abuse  CIWA: CIWA-Ar BP: 129/74 Pulse Rate: 89 COWS:    Allergies: No Known Allergies  Home Medications:  (Not in a hospital admission)  OB/GYN Status:  No LMP recorded. Patient is postmenopausal.  General Assessment Data Location of Assessment: [facility] TTS Assessment: In system Is this a Tele  or Face-to-Face Assessment?: Face-to-Face Is this an Initial Assessment or a Re-assessment for this encounter?: Initial Assessment Patient Accompanied by:: (alone) Language Other than English: No What gender do you identify as?: Female Marital status: (UTA due to AMS) Pregnancy Status: No Living Arrangements: Alone Can pt return to current living arrangement?: Yes Admission Status: Involuntary Petitioner: Family member Is patient capable of signing voluntary admission?: No Referral Source: Self/Family/Friend Insurance type: [facility]     Crisis Care Plan Living Arrangements: Alone Name of Psychiatrist: UTA due to [place] Name of Therapist: UTA due to AMS  Education Status Is patient currently in school?: No Is the patient employed, unemployed or receiving disability?: (UTA due to [place])  Risk to self with the past 6 months Suicidal Ideation:  No(pt denies) Has patient been a risk to self within the past 6 months prior to admission? : No Suicidal Intent: No Has patient had any suicidal intent within the past 6 months prior to admission? : No Is patient at risk for suicide?: No Suicidal Plan?: No Has patient had any suicidal plan within the past 6 months prior to admission? : No Access to Means: No What has been your use of drugs/alcohol within the last 12 months?: UTA due to AMS Previous Attempts/Gestures: (UTA due to AMS) Triggers for Past Attempts: Unknown Intentional Self Injurious Behavior: (UTA due to AMS) Family Suicide History: Unable to assess Recent stressful life event(s): Recent negative physical changes Persecutory voices/beliefs?: (UTA due to AMS) Depression: (UTA due to AMS) Depression Symptoms: (UTA due to AMS) Substance abuse history and/or treatment for substance abuse?: (UTA due to AMS) Suicide prevention information given to non-admitted patients: Not applicable  Risk to Others within the past 6 months Homicidal Ideation: No(pt denies) Does patient have any lifetime risk of violence toward others beyond the six months prior to admission? : No Thoughts of Harm to Others: No Current Homicidal Intent: No Current Homicidal Plan: No Access to Homicidal Means: No History of harm to others?: No Assessment of Violence: None Noted Does patient have access to weapons?: (UTA due to [place]) Criminal Charges Pending?: No Does patient have a court date: No Is patient on probation?: No  Psychosis Hallucinations: (UTA due to AMS) Delusions: Unspecified  Mental Status Report Appearance/Hygiene: Disheveled Eye Contact: Poor Motor Activity: Unsteady Speech: Word salad Level of Consciousness: Drowsy Mood: Labile, Preoccupied Affect: Inconsistent with thought content, Labile Anxiety Level: None Thought Processes: Irrelevant, Flight of Ideas Judgement: Impaired Orientation: Not oriented Obsessive Compulsive  Thoughts/Behaviors: None  Cognitive Functioning Concentration: Poor Memory: Remote Impaired, Recent Impaired Is patient IDD: No Insight: Poor Impulse Control: Poor Appetite: (UTA due to AMS) Have you had any weight changes? : (UTA due to AMS) Sleep: Unable to Assess Vegetative Symptoms: Unable to Assess  ADLScreening [facility] Assessment Services) Patient's cognitive ability adequate to safely complete daily activities?: No Patient able to express need for assistance with ADLs?: Yes Independently performs ADLs?: No  Prior Inpatient Therapy Prior Inpatient Therapy: (UTA due to AMS)  Prior Outpatient Therapy Prior Outpatient Therapy: (UTA due to AMS)  ADL Screening (condition at time of admission) Patient's cognitive ability adequate to safely complete daily activities?: No Is the patient deaf or have difficulty hearing?: Yes Does the patient have difficulty seeing, even when wearing glasses/contacts?: Yes Does the patient have difficulty concentrating, remembering, or making decisions?: Yes Patient able to express need for assistance with ADLs?: Yes Does the patient have difficulty dressing or bathing?: No Independently performs ADLs?: No Communication: Independent Dressing (  OT): Needs assistance Is this a change from baseline?: Pre-admission baseline Grooming: Independent Feeding: Independent Bathing: Needs assistance Is this a change from baseline?: Pre-admission baseline Toileting: Needs assistance Is this a change from baseline?: Pre-admission baseline In/Out Bed: Needs assistance Is this a change from baseline?: Pre-admission baseline Walks in Home: Needs assistance Is this a change from baseline?: Pre-admission baseline Does the patient have difficulty walking or climbing stairs?: Yes Weakness of Legs: Both Weakness of Arms/Hands: None  Home Assistive Devices/Equipment Home Assistive Devices/Equipment: Cane (specify quad or straight)    Abuse/Neglect Assessment  (Assessment to be complete while patient is alone) Abuse/Neglect Assessment Can Be Completed: Unable to assess, patient is non-responsive or altered mental status     Advance Directives (For Healthcare) Does Patient Have a Medical Advance Directive?: Yes Type of Advance Directive: [place] in Chart?: No - copy requested          Disposition: Per Patriciaann Clan, PA pt is psych cleared and recommends SW follow up in order to assist with possibly placement in memory care or assisted living facility. EDP Little, Wenda Overland, MD and pt's nurse Luz Lex, RN have been advised.  Disposition Initial Assessment Completed for this Encounter: Yes Patient referred to: Social Work  On Site Evaluation by:   Reviewed with Physician:    Lyanne Co 03/13/2018 12:00 AM

## 2018-03-12 NOTE — ED Provider Notes (Signed)
[facility] DEPT Provider Note   CSN: [id number] Arrival date & time: 03/12/18  1805     History   Chief Complaint Chief Complaint  Patient presents with  . Altered Mental Status  . IVC    HPI Kimberly Mclean is a 77 y.o. female.  77yo F w/ PMH below including frontotemporal dementia, CKD, HTN, colon CA who p/w fall.  Per EMS report, the patient was at home alone and found by family member on the floor 3 hours prior to EMS arrival.  Family was trying to have her evaluated but she kept refusing, swinging her cane at people and not cooperating.  Family member took out IVC papers on patient.  EMS reported she was able to walk out of her home.  They reported a temperature of 100.6 but this was measured temporally.  Gave 840 mg Tylenol in route.  LEVEL 5 CAVEAT DUE TO DEMENTIA  The history is provided by the EMS personnel and the police.  Altered Mental Status      Past Medical History:  Diagnosis Date  . Anemia   . Anxiety   . Aortic atherosclerosis ([place])   . Arthritis   . Back pain   . Chest pain   . Chronic renal disease, stage II   . Constipation   . 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 Depression   . Fibromyositis   . Glaucoma    right eye  . Gross hematuria   . Hair loss   . Hemorrhoids   . History of pneumothorax    left  . Horseshoe kidney   . Hyperlipidemia   . Hypertension   . Hypertensive renal disease, benign   . Malignant neoplasm of colon ([place])   . Multiple rib fractures    Left side: eibs 3 through 6  . Osteopenia   . Osteoporosis   . Personal history of DVT (deep vein thrombosis)   . Problems with hearing   . Renal cyst   . Urinary urgency   . Visual problems   . Vitamin D deficiency     Patient Active Problem List   Diagnosis Date Noted  . Carpal tunnel syndrome 12/08/2016  . Osteoporosis 10/14/2016  . Memory deficit 10/14/2016  . Diverticulosis 01/16/2012  . Hemorrhoids 01/16/2012  . HTN (hypertension)  01/16/2012    Past Surgical History:  Procedure Laterality Date  . BREAST EXCISIONAL BIOPSY Left 1993   no visible scar  . BREAST EXCISIONAL BIOPSY Left 1992   no visible scar  . BREAST SURGERY  1970's   removed 2 benign lumps  . LUNG SURGERY     left lung punctured-surgically repaired  . TOTAL ABDOMINAL HYSTERECTOMY       OB History   None      Home Medications    Prior to Admission medications   Medication Sig Start Date End Date Taking? Authorizing Provider  amLODipine (NORVASC) 2.5 MG tablet Take 1 tablet (2.5 mg total) by mouth daily. 10/29/17  Yes Lauree Chandler, NP  cholecalciferol (VITAMIN D) 1000 units tablet Take 1 tablet (1,000 Units total) by mouth daily. 01/30/17  Yes Eulas Post, Monica, DO  donepezil (ARICEPT) 5 MG tablet Take 1 tablet (5 mg total) by mouth at bedtime. For memory loss 01/20/18  Yes Eulas Post, Brayton Layman, DO  acetaminophen (TYLENOL) 325 MG tablet Take 650 mg by mouth every 6 (six) hours as needed.    [provider]  dorzolamide-timolol (COSOPT) 22.3-6.8 MG/ML ophthalmic solution  INSTILL 1 DROP INTO BOTH EYES TWICE DAILY 12/13/15   [provider]  latanoprost (XALATAN) 0.005 % ophthalmic solution INSTILL 1 DROP INTO BOTH EYES AT BEDTIME 03/10/16   [provider]  losartan (COZAAR) 100 MG tablet Take 1 tablet (100 mg total) by mouth daily. 10/29/17   Lauree Chandler, NP  predniSONE (DELTASONE) 5 MG tablet Take 1 tablet (5 mg total) by mouth daily. 01/30/17   Gildardo Cranker, DO  pregabalin (LYRICA) 50 MG capsule Take 1 capsule (50 mg total) by mouth 2 (two) times daily. 01/20/18   Gildardo Cranker, DO  solifenacin (VESICARE) 5 MG tablet Take 1 tablet (5 mg total) by mouth daily. 09/15/17   Lauree Chandler, NP    Family History Family History  Problem Relation Age of Onset  . Prostate cancer Father   . Hypertension Mother        brother also  . Emphysema Mother   . Lung cancer Sister   . Colon cancer Neg Hx     Social  History Social History   Tobacco Use  . Smoking status: Never Smoker  . Smokeless tobacco: Never Used  Substance Use Topics  . Alcohol use: No  . Drug use: No     Allergies   Patient has no known allergies.   Review of Systems Review of Systems  Unable to perform ROS: Dementia     Physical Exam Updated Vital Signs BP (!) 162/95 (BP Location: Left Arm)   Pulse 81   Temp 97.9 F (36.6 C) (Oral)   Resp 18   SpO2 100%   Physical Exam  Constitutional: She appears well-developed. No distress.  Cachectic, comfortable, calm  HENT:  Head: Normocephalic and atraumatic.  Moist mucous membranes  Eyes: Pupils are equal, round, and reactive to light. Conjunctivae are normal.  Neck: Neck supple.  Cardiovascular: Normal rate and regular rhythm.  Murmur heard. Pulmonary/Chest: Effort normal and breath sounds normal.  Abdominal: Soft. Bowel sounds are normal. She exhibits no distension. There is no tenderness.  Musculoskeletal: Normal range of motion. She exhibits no edema or tenderness.  Neurological: She is alert.  oriented to person, able to follow basic commands, some confusion  Skin: Skin is warm and dry.  Psychiatric:  Calm, cooperative  Nursing note and vitals reviewed.    ED Treatments / Results  Labs (all labs ordered are listed, but only abnormal results are displayed) Labs Reviewed  COMPREHENSIVE METABOLIC PANEL - Abnormal; Notable for the following components:      Result Value   Sodium 146 (*)    Glucose, Bld 162 (*)    BUN 29 (*)    AST 80 (*)    ALT 56 (*)    Total Bilirubin 1.5 (*)    All other components within normal limits  CBC WITH DIFFERENTIAL/PLATELET - Abnormal; Notable for the following components:   RBC 5.26 (*)    HCT 47.1 (*)    All other components within normal limits  CK - Abnormal; Notable for the following components:   Total CK 2,126 (*)    All other components within normal limits  URINALYSIS, ROUTINE W REFLEX MICROSCOPIC -  Abnormal; Notable for the following components:   Color, Urine AMBER (*)    APPearance CLOUDY (*)    Hgb urine dipstick LARGE (*)    Ketones, ur 5 (*)    Protein, ur 100 (*)    Leukocytes, UA TRACE (*)    RBC / HPF >50 (*)  All other components within normal limits  CK - Abnormal; Notable for the following components:   Total CK 2,070 (*)    All other components within normal limits  I-STAT CG4 LACTIC ACID, ED - Abnormal; Notable for the following components:   Lactic Acid, Venous 2.92 (*)    All other components within normal limits  ETHANOL  CK  I-STAT CG4 LACTIC ACID, ED    EKG None  Radiology Ct Head Wo Contrast  Result Date: 03/12/2018 CLINICAL DATA:  Unwitnessed fall. EXAM: CT HEAD WITHOUT CONTRAST TECHNIQUE: Contiguous axial images were obtained from the base of the skull through the vertex without intravenous contrast. COMPARISON:  CT scan of November 09, 2017. FINDINGS: Brain: Mild chronic ischemic white matter disease is noted. No mass effect or midline shift is noted. Ventricular size is within normal limits. There is no evidence of mass lesion, hemorrhage or acute infarction. Vascular: No hyperdense vessel or unexpected calcification. Skull: Normal. Negative for fracture or focal lesion. Sinuses/Orbits: Chronic right maxillary sinusitis is noted. Other: None. IMPRESSION: Mild chronic ischemic white matter disease. No acute intracranial abnormality seen. Electronically Signed   By: Marijo Conception, M.D.   On: 03/12/2018 19:38   US Abdomen Limited  Result Date: 03/12/2018 CLINICAL DATA:  Elevated LFTs and bilirubin. EXAM: ULTRASOUND ABDOMEN LIMITED RIGHT UPPER QUADRANT COMPARISON:  CT of the abdomen and pelvis performed 11/24/2016 FINDINGS: Gallbladder: No gallstones or wall thickening visualized. No sonographic Murphy sign noted by sonographer. Common bile duct: Diameter: 0.5 cm, within normal limits in caliber. Liver: No focal lesion identified. Within normal limits in  parenchymal echogenicity. Portal vein is patent on color Doppler imaging with normal direction of blood flow towards the liver. IMPRESSION: Unremarkable ultrasound of the right upper quadrant. Electronically Signed   By: Garald Balding M.D.   On: 03/12/2018 22:06    Procedures Procedures (including critical care time)  Medications Ordered in ED Medications  sodium chloride 0.9 % bolus 1,000 mL (0 mLs Intravenous Stopped 03/12/18 2129)  sodium chloride 0.9 % bolus 1,000 mL (1,000 mLs Intravenous New Bag/Given 03/12/18 2110)     Initial Impression / Assessment and Plan / ED Course  I have reviewed the triage vital signs and the nursing notes.  Pertinent labs & imaging results that were available during my care of the patient were reviewed by me and considered in my medical decision making (see chart for details).    Calm on my exam, VS reassuring, temp normal. Since temp was measured on the forehead, I suspect it may have been inaccurate. Obtained screening labs and head CT given she was found on the ground.  Head CT negative acute.  Lab work shows UA containing blood, no evidence of infection, CK 2000, initial lactate 2.92, BUN 29, creatinine 0.86.  AST 80, ALT 56, total bilirubin 1.5.  Lab work suggests dehydration, gave 2 L of IV fluids.  Although patient has no focal abdominal tenderness, she is a poor historian therefore obtain right upper quadrant ultrasound because of lab abnormalities.  Ultrasound was unremarkable.  I contacted TTS because patient was involuntarily committed by family.  They have evaluated patient and determined she does not require inpatient psychiatric treatment but rather her symptoms are likely related to underlying dementia.  They have recommended social work evaluation.  Social worker, Roderic Palau, has evaluated the patient and contacted family.  I am concerned that the patient lives alone given her waxing and waning dementia symptoms and likely inability to perform  ADLs  and remain safe at home alone.  I have ordered repeat CK after hydration, which is pending.  Signing patient out to oncoming provider, Dr. Roxanne Mins, who for follow-up on CK.  Final Clinical Impressions(s) / ED Diagnoses   Final diagnoses:  None    ED Discharge Orders    None       Isiac Breighner, Wenda Overland, MD 03/13/18 0041

## 2018-03-12 NOTE — Progress Notes (Signed)
Per Kimberly Clan, PA pt is psych cleared and recommends SW follow up in order to assist with possibly placement in memory care or assisted living facility. EDP Kimberly Mclean, Wenda Overland, MD and pt's nurse Luz Lex, RN have been advised.  Lind Covert, MSW, LCSW Therapeutic Triage Specialist  [phone]

## 2018-03-12 NOTE — Progress Notes (Addendum)
CSW spoke to EPD who states chart review indicates a HX of known dementia and that pt lives by herself, a family member found her on the floor and pt refused EMS treatment, and was swinging a cane at EMS workers and as a result, pt's  family IVC'd her.  Per EPD, per chart the pt has previously refused [facility] services.  Per the EPD, presents with some confusion and has difficulty answering questions and that symptoms resemble dementia-like symptoms.  CSW will continue to follow for D/C needs.  Alphonse Guild. Osha Errico, LCSW, LCAS, CSI Clinical Social Worker Ph: [phone]

## 2018-03-12 NOTE — ED Notes (Signed)
Bed: WA04 Expected date:  Expected time:  Means of arrival:  Comments: 77 yo - IVC, AMS, Sepsis?

## 2018-03-13 ENCOUNTER — Encounter (HOSPITAL_COMMUNITY): Payer: Self-pay | Admitting: Internal Medicine

## 2018-03-13 ENCOUNTER — Other Ambulatory Visit: Payer: Self-pay

## 2018-03-13 ENCOUNTER — Observation Stay (HOSPITAL_COMMUNITY): Payer: Medicare Other

## 2018-03-13 DIAGNOSIS — E860 Dehydration: Secondary | ICD-10-CM

## 2018-03-13 DIAGNOSIS — R945 Abnormal results of liver function studies: Secondary | ICD-10-CM

## 2018-03-13 DIAGNOSIS — G9340 Encephalopathy, unspecified: Secondary | ICD-10-CM | POA: Insufficient documentation

## 2018-03-13 DIAGNOSIS — T796XXA Traumatic ischemia of muscle, initial encounter: Secondary | ICD-10-CM

## 2018-03-13 DIAGNOSIS — F0391 Unspecified dementia with behavioral disturbance: Secondary | ICD-10-CM

## 2018-03-13 DIAGNOSIS — M6282 Rhabdomyolysis: Secondary | ICD-10-CM | POA: Diagnosis present

## 2018-03-13 DIAGNOSIS — R7989 Other specified abnormal findings of blood chemistry: Secondary | ICD-10-CM | POA: Diagnosis present

## 2018-03-13 DIAGNOSIS — F03918 Unspecified dementia, unspecified severity, with other behavioral disturbance: Secondary | ICD-10-CM

## 2018-03-13 DIAGNOSIS — L8990 Pressure ulcer of unspecified site, unspecified stage: Secondary | ICD-10-CM

## 2018-03-13 LAB — CBC WITH DIFFERENTIAL/PLATELET
Abs Immature Granulocytes: 0.01 10*3/uL (ref 0.00–0.07)
Basophils Absolute: 0 10*3/uL (ref 0.0–0.1)
Basophils Relative: 1 %
Eosinophils Absolute: 0.1 10*3/uL (ref 0.0–0.5)
Eosinophils Relative: 1 %
HEMATOCRIT: 39.2 % (ref 36.0–46.0)
HEMOGLOBIN: 12.1 g/dL (ref 12.0–15.0)
Immature Granulocytes: 0 %
LYMPHS PCT: 16 %
Lymphs Abs: 0.9 10*3/uL (ref 0.7–4.0)
MCH: 27.6 pg (ref 26.0–34.0)
MCHC: 30.9 g/dL (ref 30.0–36.0)
MCV: 89.5 fL (ref 80.0–100.0)
MONO ABS: 0.8 10*3/uL (ref 0.1–1.0)
MONOS PCT: 14 %
NEUTROS ABS: 4 10*3/uL (ref 1.7–7.7)
Neutrophils Relative %: 68 %
Platelets: 264 10*3/uL (ref 150–400)
RBC: 4.38 MIL/uL (ref 3.87–5.11)
RDW: 12.3 % (ref 11.5–15.5)
WBC: 5.9 10*3/uL (ref 4.0–10.5)
nRBC: 0 % (ref 0.0–0.2)

## 2018-03-13 LAB — HEPATIC FUNCTION PANEL
ALK PHOS: 47 U/L (ref 38–126)
ALT: 42 U/L (ref 0–44)
AST: 55 U/L — ABNORMAL HIGH (ref 15–41)
Albumin: 3.1 g/dL — ABNORMAL LOW (ref 3.5–5.0)
BILIRUBIN INDIRECT: 1 mg/dL — AB (ref 0.3–0.9)
Bilirubin, Direct: 0.2 mg/dL (ref 0.0–0.2)
TOTAL PROTEIN: 5.3 g/dL — AB (ref 6.5–8.1)
Total Bilirubin: 1.2 mg/dL (ref 0.3–1.2)

## 2018-03-13 LAB — BASIC METABOLIC PANEL
ANION GAP: 7 (ref 5–15)
BUN: 23 mg/dL (ref 8–23)
CO2: 26 mmol/L (ref 22–32)
Calcium: 8.5 mg/dL — ABNORMAL LOW (ref 8.9–10.3)
Chloride: 108 mmol/L (ref 98–111)
Creatinine, Ser: 0.77 mg/dL (ref 0.44–1.00)
Glucose, Bld: 122 mg/dL — ABNORMAL HIGH (ref 70–99)
POTASSIUM: 3.9 mmol/L (ref 3.5–5.1)
SODIUM: 141 mmol/L (ref 135–145)

## 2018-03-13 LAB — LACTIC ACID, PLASMA
Lactic Acid, Venous: 2 mmol/L (ref 0.5–1.9)
Lactic Acid, Venous: 1.6 mmol/L (ref 0.5–1.9)

## 2018-03-13 LAB — AMMONIA: Ammonia: 35 umol/L (ref 9–35)

## 2018-03-13 LAB — CK
CK TOTAL: 1596 U/L — AB (ref 38–234)
Total CK: 1146 U/L — ABNORMAL HIGH (ref 38–234)

## 2018-03-13 LAB — TSH: TSH: 0.492 u[IU]/mL (ref 0.350–4.500)

## 2018-03-13 LAB — VITAMIN B12: VITAMIN B 12: 411 pg/mL (ref 180–914)

## 2018-03-13 MED ORDER — ENOXAPARIN SODIUM 40 MG/0.4ML ~~LOC~~ SOLN
40.0000 mg | SUBCUTANEOUS | Status: DC
  Administered 2018-03-13 – 2018-03-18 (×5): 40 mg via SUBCUTANEOUS
  Filled 2018-03-13 (×7): qty 0.4

## 2018-03-13 MED ORDER — CHLORHEXIDINE GLUCONATE 0.12 % MT SOLN
15.0000 mL | Freq: Two times a day (BID) | OROMUCOSAL | Status: DC
  Administered 2018-03-13 – 2018-03-18 (×10): 15 mL via OROMUCOSAL
  Filled 2018-03-13 (×11): qty 15

## 2018-03-13 MED ORDER — SODIUM CHLORIDE 0.9 % IV SOLN: INTRAVENOUS | Status: DC

## 2018-03-13 MED ORDER — ORAL CARE MOUTH RINSE
15.0000 mL | Freq: Two times a day (BID) | OROMUCOSAL | Status: DC
  Administered 2018-03-13 – 2018-03-17 (×2): 15 mL via OROMUCOSAL

## 2018-03-13 MED ORDER — ALPRAZOLAM 0.25 MG PO TABS
0.2500 mg | ORAL_TABLET | Freq: Once | ORAL | Status: AC
  Administered 2018-03-13: 0.25 mg via ORAL
  Filled 2018-03-13: qty 1

## 2018-03-13 MED ORDER — ONDANSETRON HCL 4 MG/2ML IJ SOLN: 4.0000 mg | Freq: Four times a day (QID) | INTRAMUSCULAR | Status: DC | PRN

## 2018-03-13 MED ORDER — ACETAMINOPHEN 650 MG RE SUPP: 650.0000 mg | Freq: Four times a day (QID) | RECTAL | Status: DC | PRN

## 2018-03-13 MED ORDER — ACETAMINOPHEN 325 MG PO TABS
650.0000 mg | ORAL_TABLET | Freq: Four times a day (QID) | ORAL | Status: DC | PRN
  Administered 2018-03-13 – 2018-03-14 (×2): 650 mg via ORAL
  Filled 2018-03-13 (×2): qty 2

## 2018-03-13 MED ORDER — SODIUM CHLORIDE 0.9 % IV SOLN
1.0000 g | Freq: Every day | INTRAVENOUS | Status: DC
  Administered 2018-03-13 – 2018-03-17 (×5): 1 g via INTRAVENOUS
  Filled 2018-03-13 (×5): qty 1

## 2018-03-13 MED ORDER — DEXTROSE-NACL 5-0.9 % IV SOLN
INTRAVENOUS | Status: DC
  Administered 2018-03-13: 04:00:00 via INTRAVENOUS

## 2018-03-13 MED ORDER — DEXTROSE-NACL 5-0.9 % IV SOLN
INTRAVENOUS | Status: AC
  Administered 2018-03-13 – 2018-03-16 (×5): via INTRAVENOUS

## 2018-03-13 MED ORDER — ENSURE ENLIVE PO LIQD
237.0000 mL | Freq: Two times a day (BID) | ORAL | Status: DC
  Administered 2018-03-13 – 2018-03-15 (×5): 237 mL via ORAL

## 2018-03-13 MED ORDER — ONDANSETRON HCL 4 MG PO TABS: 4.0000 mg | ORAL_TABLET | Freq: Four times a day (QID) | ORAL | Status: DC | PRN

## 2018-03-13 MED ORDER — POLYETHYLENE GLYCOL 3350 17 G PO PACK
17.0000 g | PACK | Freq: Once | ORAL | Status: AC
  Administered 2018-03-13: 17 g via ORAL
  Filled 2018-03-13: qty 1

## 2018-03-13 NOTE — ED Notes (Signed)
ED TO INPATIENT HANDOFF REPORT  Name/Age/Gender Kimberly Mclean 77 y.o. female  Code Status Advance Directive Documentation     Most Recent Value  Type of Advance Directive  Healthcare Power of Attorney  Pre-existing out of facility DNR order (yellow form or pink MOST form)  -  "MOST" Form in Place?  -      Home/SNF/Other Home  Chief Complaint altered   Level of Care/Admitting Diagnosis ED Disposition    ED Disposition Condition Onset: [place] [100102]  Level of Care: Telemetry [5]  Admit to tele based on following criteria: Monitor for Ischemic changes  Diagnosis: Acute encephalopathy [485462]  Admitting Physician: Rise Patience [phone]  Attending Physician: Rise Patience [[id number]]  PT Class (Do Not Modify): Observation [104]  PT Acc Code (Do Not Modify): Observation [10022]       Medical History Past Medical History:  Diagnosis Date  . Anemia   . Anxiety   . Aortic atherosclerosis ([place])   . Arthritis   . Back pain   . Chest pain   . Chronic renal disease, stage II   . Constipation   . 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 Depression   . Fibromyositis   . Glaucoma    right eye  . Gross hematuria   . Hair loss   . Hemorrhoids   . History of pneumothorax    left  . Horseshoe kidney   . Hyperlipidemia   . Hypertension   . Hypertensive renal disease, benign   . Malignant neoplasm of colon ([place])   . Multiple rib fractures    Left side: eibs 3 through 6  . Osteopenia   . Osteoporosis   . Personal history of DVT (deep vein thrombosis)   . Problems with hearing   . Renal cyst   . Urinary urgency   . Visual problems   . Vitamin D deficiency     Allergies No Known Allergies  IV Location/Drains/Wounds Patient Lines/Drains/Airways Status   Active Line/Drains/Airways    Name:   Placement date:   Placement time:   Site:   Days:   Peripheral IV 03/12/18 Right;Lateral Antecubital   03/12/18    2027     Antecubital   1   External Urinary Catheter   03/12/18    2121    -   1          Labs/Imaging Results for orders placed or performed during the hospital encounter of 03/12/18 (from the past 48 hour(s))  CK     Status: Abnormal   Collection Time: 03/12/18  7:50 PM  Result Value Ref Range   Total CK 2,070 (H) 38 - 234 U/L    Comment: Performed at [facility], [place] [address] [place], [place] [address]  Comprehensive metabolic panel     Status: Abnormal   Collection Time: 03/12/18  7:51 PM  Result Value Ref Range   Sodium 146 (H) 135 - 145 mmol/L   Potassium 3.9 3.5 - 5.1 mmol/L   Chloride 106 98 - 111 mmol/L   CO2 31 22 - 32 mmol/L   Glucose, Bld 162 (H) 70 - 99 mg/dL   BUN 29 (H) 8 - 23 mg/dL   Creatinine, Ser 0.86 0.44 - 1.00 mg/dL   Calcium 9.9 8.9 - 10.3 mg/dL   Total Protein 7.3 6.5 - 8.1 g/dL   Albumin 4.1 3.5 - 5.0 g/dL   AST  80 (H) 15 - 41 U/L   ALT 56 (H) 0 - 44 U/L   Alkaline Phosphatase 58 38 - 126 U/L   Total Bilirubin 1.5 (H) 0.3 - 1.2 mg/dL   GFR calc non Af Amer >60 >60 mL/min   GFR calc Af Amer >60 >60 mL/min    Comment: (NOTE) The eGFR has been calculated using the CKD EPI equation. This calculation has not been validated in all clinical situations. eGFR's persistently <60 mL/min signify possible Chronic Kidney Disease.    Anion gap 9 5 - 15    Comment: Performed at [facility], [place] [address] [place], [place] [address]  Ethanol     Status: None   Collection Time: 03/12/18  7:51 PM  Result Value Ref Range   Alcohol, Ethyl (B) <10 <10 mg/dL    Comment: (NOTE) Lowest detectable limit for serum alcohol is 10 mg/dL. For medical purposes only. Performed at [facility], [place] [address], [place], [place] [address]   CBC with Differential     Status: Abnormal   Collection Time: 03/12/18  7:51 PM  Result Value Ref Range   WBC 8.0 4.0 - 10.5 K/uL   RBC 5.26 (H) 3.87 - 5.11 MIL/uL   Hemoglobin  14.5 12.0 - 15.0 g/dL   HCT 47.1 (H) 36.0 - 46.0 %   MCV 89.5 80.0 - 100.0 fL   MCH 27.6 26.0 - 34.0 pg   MCHC 30.8 30.0 - 36.0 g/dL   RDW 12.2 11.5 - 15.5 %   Platelets 318 150 - 400 K/uL   nRBC 0.0 0.0 - 0.2 %   Neutrophils Relative % 74 %   Neutro Abs 5.9 1.7 - 7.7 K/uL   Lymphocytes Relative 13 %   Lymphs Abs 1.1 0.7 - 4.0 K/uL   Monocytes Relative 12 %   Monocytes Absolute 1.0 0.1 - 1.0 K/uL   Eosinophils Relative 0 %   Eosinophils Absolute 0.0 0.0 - 0.5 K/uL   Basophils Relative 0 %   Basophils Absolute 0.0 0.0 - 0.1 K/uL   Immature Granulocytes 1 %   Abs Immature Granulocytes 0.04 0.00 - 0.07 K/uL    Comment: Performed at [facility], [place] [address] [place], [place] [address]  CK     Status: Abnormal   Collection Time: 03/12/18  7:51 PM  Result Value Ref Range   Total CK 2,126 (H) 38 - 234 U/L    Comment: Performed at [facility], [place] [address], [place], [place] [address]  I-Stat CG4 Lactic Acid, ED     Status: Abnormal   Collection Time: 03/12/18  7:59 PM  Result Value Ref Range   Lactic Acid, Venous 2.92 (HH) 0.5 - 1.9 mmol/L   Comment NOTIFIED PHYSICIAN   Urinalysis, Routine w reflex microscopic     Status: Abnormal   Collection Time: 03/12/18 10:21 PM  Result Value Ref Range   Color, Urine AMBER (A) YELLOW    Comment: BIOCHEMICALS MAY BE AFFECTED BY COLOR   APPearance CLOUDY (A) CLEAR   Specific Gravity, Urine 1.023 1.005 - 1.030   pH 6.0 5.0 - 8.0   Glucose, UA NEGATIVE NEGATIVE mg/dL   Hgb urine dipstick LARGE (A) NEGATIVE   Bilirubin Urine NEGATIVE NEGATIVE   Ketones, ur 5 (A) NEGATIVE mg/dL   Protein, ur 100 (A) NEGATIVE mg/dL   Nitrite NEGATIVE NEGATIVE   Leukocytes, UA TRACE (A) NEGATIVE   RBC / HPF >50 (H) 0 -  5 RBC/hpf   WBC, UA 6-10 0 - 5 WBC/hpf   Bacteria, UA NONE SEEN NONE SEEN   Squamous Epithelial / LPF 0-5 0 - 5   Mucus PRESENT    Budding Yeast PRESENT     Comment: Performed at [facility], [place] [address], [place], [place] [address]  CK     Status: Abnormal   Collection Time: 03/13/18 12:25 AM  Result Value Ref Range   Total CK 1,596 (H) 38 - 234 U/L    Comment: Performed at [facility], [place] [address], [place], [place] [address]   Ct Head Wo Contrast  Result Date: 03/12/2018 CLINICAL DATA:  Unwitnessed fall. EXAM: CT HEAD WITHOUT CONTRAST TECHNIQUE: Contiguous axial images were obtained from the base of the skull through the vertex without intravenous contrast. COMPARISON:  CT scan of November 09, 2017. FINDINGS: Brain: Mild chronic ischemic white matter disease is noted. No mass effect or midline shift is noted. Ventricular size is within normal limits. There is no evidence of mass lesion, hemorrhage or acute infarction. Vascular: No hyperdense vessel or unexpected calcification. Skull: Normal. Negative for fracture or focal lesion. Sinuses/Orbits: Chronic right maxillary sinusitis is noted. Other: None. IMPRESSION: Mild chronic ischemic white matter disease. No acute intracranial abnormality seen. Electronically Signed   By: Marijo Conception, M.D.   On: 03/12/2018 19:38   US Abdomen Limited  Result Date: 03/12/2018 CLINICAL DATA:  Elevated LFTs and bilirubin. EXAM: ULTRASOUND ABDOMEN LIMITED RIGHT UPPER QUADRANT COMPARISON:  CT of the abdomen and pelvis performed 11/24/2016 FINDINGS: Gallbladder: No gallstones or wall thickening visualized. No sonographic Murphy sign noted by sonographer. Common bile duct: Diameter: 0.5 cm, within normal limits in caliber. Liver: No focal lesion identified. Within normal limits in parenchymal echogenicity. Portal vein is patent on color Doppler imaging with normal direction of blood flow towards the liver. IMPRESSION: Unremarkable ultrasound of the right upper quadrant. Electronically Signed   By: Garald Balding M.D.   On: 03/12/2018 22:06   None  Pending Labs Unresulted Labs (From admission, onward)    Start      Ordered   03/13/18 0139  Culture, Urine  Once,   R     03/13/18 0138   03/13/18 0139  Culture, blood (routine x 2)  BLOOD CULTURE X 2,   R     03/13/18 0138          Vitals/Pain Today's Vitals   03/12/18 1952 03/12/18 2000 03/12/18 2121 03/13/18 0035  BP: (!) 149/80  129/74 (!) 162/95  Pulse: (!) 101  89 81  Resp: _0 Temp:  (!) 97.5 F (36.4 C)  97.9 F (36.6 C)  TempSrc:    Oral  SpO2: 98%  97% 100%  PainSc:        Isolation Precautions No active isolations  Medications Medications  sodium chloride 0.9 % bolus 1,000 mL (0 mLs Intravenous Stopped 03/12/18 2129)  sodium chloride 0.9 % bolus 1,000 mL (1,000 mLs Intravenous New Bag/Given 03/12/18 2110)    Mobility walks with person assist

## 2018-03-13 NOTE — H&P (Signed)
History and Physical    Kimberly Mclean YPP:[id number] DOB: 18-Nov-1940 DOA: 03/12/2018  PCP: Lauree Chandler, NP  Patient coming from: From home.  Chief Complaint: Fall.  Most of the history was obtained from ER physician as patient has dementia.  No family at the bedside.  HPI: Kimberly Mclean is a 77 y.o. female with history of dementia frequent falls hypertension was brought to the ER after patient's family found the patient was on the floor.  Patient was refusing to come to the ER and patient was involuntarily committed.  Not sure how the patient fell.  But as per the notes of her primary care physician in the medical record showed the patient has been having frequent falls.  Patient states that she has been having more falls after her recent start of medication.  Records show that he was started on Aricept in September.  Patient also was found to be mildly febrile for which patient was given Tylenol.   ED Course: In the ER CT head is unremarkable.  Patient was mildly febrile.  UA shows features concerning for UTI.  Patient's lactate was elevated along with elevated LFTs and CK levels.  Patient was given fluid bolus for rhabdomyolysis and admitted for further management.  On my exam patient denies any pain or shortness of breath.  Is able to move all extremities but generally weak.  Review of Systems: As per HPI, rest all negative.   Past Medical History:  Diagnosis Date  . Anemia   . Anxiety   . Aortic atherosclerosis ([place])   . Arthritis   . Back pain   . Chest pain   . Chronic renal disease, stage II   . Constipation   . 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 Depression   . Fibromyositis   . Glaucoma    right eye  . Gross hematuria   . Hair loss   . Hemorrhoids   . History of pneumothorax    left  . Horseshoe kidney   . Hyperlipidemia   . Hypertension   . Hypertensive renal disease, benign   . Malignant neoplasm of colon ([place])   . Multiple rib fractures    Left side:  eibs 3 through 6  . Osteopenia   . Osteoporosis   . Personal history of DVT (deep vein thrombosis)   . Problems with hearing   . Renal cyst   . Urinary urgency   . Visual problems   . Vitamin D deficiency     Past Surgical History:  Procedure Laterality Date  . BREAST EXCISIONAL BIOPSY Left 1993   no visible scar  . BREAST EXCISIONAL BIOPSY Left 1992   no visible scar  . BREAST SURGERY  1970's   removed 2 benign lumps  . LUNG SURGERY     left lung punctured-surgically repaired  . TOTAL ABDOMINAL HYSTERECTOMY       reports that she has never smoked. She has never used smokeless tobacco. She reports that she does not drink alcohol or use drugs.  No Known Allergies  Family History  Problem Relation Age of Onset  . Prostate cancer Father   . Hypertension Mother        brother also  . Emphysema Mother   . Lung cancer Sister   . Colon cancer Neg Hx     Prior to Admission medications   Medication Sig Start Date End Date Taking? Authorizing Provider  amLODipine (NORVASC) 2.5 MG tablet Take 1  tablet (2.5 mg total) by mouth daily. 10/29/17  Yes Lauree Chandler, NP  cholecalciferol (VITAMIN D) 1000 units tablet Take 1 tablet (1,000 Units total) by mouth daily. 01/30/17  Yes Eulas Post, Monica, DO  donepezil (ARICEPT) 5 MG tablet Take 1 tablet (5 mg total) by mouth at bedtime. For memory loss 01/20/18  Yes Eulas Post, Brayton Layman, DO  acetaminophen (TYLENOL) 325 MG tablet Take 650 mg by mouth every 6 (six) hours as needed.    [provider]  dorzolamide-timolol (COSOPT) 22.3-6.8 MG/ML ophthalmic solution INSTILL 1 DROP INTO BOTH EYES TWICE DAILY 12/13/15   [provider]  latanoprost (XALATAN) 0.005 % ophthalmic solution INSTILL 1 DROP INTO BOTH EYES AT BEDTIME 03/10/16   [provider]  losartan (COZAAR) 100 MG tablet Take 1 tablet (100 mg total) by mouth daily. 10/29/17   Lauree Chandler, NP  predniSONE (DELTASONE) 5 MG tablet Take 1 tablet (5 mg total) by mouth  daily. 01/30/17   Gildardo Cranker, DO  pregabalin (LYRICA) 50 MG capsule Take 1 capsule (50 mg total) by mouth 2 (two) times daily. 01/20/18   Gildardo Cranker, DO  solifenacin (VESICARE) 5 MG tablet Take 1 tablet (5 mg total) by mouth daily. 09/15/17   Lauree Chandler, NP    Physical Exam: Vitals:   03/12/18 2000 03/12/18 2121 03/13/18 0035 03/13/18 0303  BP:  129/74 (!) 162/95 (!) 163/91  Pulse:  89 81 64  Resp:  17 18 18   Temp: (!) 97.5 F (36.4 C)  97.9 F (36.6 C) 98.7 F (37.1 C)  TempSrc:   Oral Oral  SpO2:  97% 100% 100%      Constitutional: Moderately built and poorly nourished. Vitals:   03/12/18 2000 03/12/18 2121 03/13/18 0035 03/13/18 0303  BP:  129/74 (!) 162/95 (!) 163/91  Pulse:  89 81 64  Resp:  17 18 18   Temp: (!) 97.5 F (36.4 C)  97.9 F (36.6 C) 98.7 F (37.1 C)  TempSrc:   Oral Oral  SpO2:  97% 100% 100%   Eyes: Anicteric no pallor. ENMT: No discharge from the ears eyes nose or mouth. Neck: No mass or.  No neck rigidity. Respiratory: No rhonchi or crepitations. Cardiovascular: S1-S2 heard no murmurs appreciated. Abdomen: Soft nontender bowel sounds present. Musculoskeletal: No edema.  No joint effusion. Skin: No rash. Neurologic: Alert awake oriented to name and place.  Moves all extremities. Psychiatric: Oriented to name and place.   Labs on Admission: I have personally reviewed following labs and imaging studies  CBC: Recent Labs  Lab 03/12/18 1951  WBC 8.0  NEUTROABS 5.9  HGB 14.5  HCT 47.1*  MCV 89.5  PLT 601   Basic Metabolic Panel: Recent Labs  Lab 03/12/18 1951  NA 146*  K 3.9  CL 106  CO2 31  GLUCOSE 162*  BUN 29*  CREATININE 0.86  CALCIUM 9.9   GFR: CrCl cannot be calculated (Unknown ideal weight.). Liver Function Tests: Recent Labs  Lab 03/12/18 1951  AST 80*  ALT 56*  ALKPHOS 58  BILITOT 1.5*  PROT 7.3  ALBUMIN 4.1   No results for input(s): LIPASE, AMYLASE in the last 168 hours. No results for  input(s): AMMONIA in the last 168 hours. Coagulation Profile: No results for input(s): INR, PROTIME in the last 168 hours. Cardiac Enzymes: Recent Labs  Lab 03/12/18 1950 03/12/18 1951 03/13/18 0025  CKTOTAL 2,070* 2,126* 1,596*   BNP (last 3 results) No results for input(s): PROBNP in the last 8760  hours. HbA1C: No results for input(s): HGBA1C in the last 72 hours. CBG: No results for input(s): GLUCAP in the last 168 hours. Lipid Profile: No results for input(s): CHOL, HDL, LDLCALC, TRIG, CHOLHDL, LDLDIRECT in the last 72 hours. Thyroid Function Tests: No results for input(s): TSH, T4TOTAL, FREET4, T3FREE, THYROIDAB in the last 72 hours. Anemia Panel: No results for input(s): VITAMINB12, FOLATE, FERRITIN, TIBC, IRON, RETICCTPCT in the last 72 hours. Urine analysis:    Component Value Date/Time   COLORURINE AMBER (A) 03/12/2018 2221   APPEARANCEUR CLOUDY (A) 03/12/2018 2221   LABSPEC 1.023 03/12/2018 2221   PHURINE 6.0 03/12/2018 2221   GLUCOSEU NEGATIVE 03/12/2018 2221   HGBUR LARGE (A) 03/12/2018 2221   BILIRUBINUR NEGATIVE 03/12/2018 2221   BILIRUBINUR neg 09/01/2016 1155   KETONESUR 5 (A) 03/12/2018 2221   PROTEINUR 100 (A) 03/12/2018 2221   UROBILINOGEN 0.2 09/01/2016 1155   UROBILINOGEN 0.2 10/03/2012 1237   NITRITE NEGATIVE 03/12/2018 2221   LEUKOCYTESUR TRACE (A) 03/12/2018 2221   Sepsis Labs: @LABRCNTIP (procalcitonin:4,lacticidven:4) )No results found for this or any previous visit (from the past 240 hour(s)).   Radiological Exams on Admission: Ct Head Wo Contrast  Result Date: 03/12/2018 CLINICAL DATA:  Unwitnessed fall. EXAM: CT HEAD WITHOUT CONTRAST TECHNIQUE: Contiguous axial images were obtained from the base of the skull through the vertex without intravenous contrast. COMPARISON:  CT scan of November 09, 2017. FINDINGS: Brain: Mild chronic ischemic white matter disease is noted. No mass effect or midline shift is noted. Ventricular size is within normal  limits. There is no evidence of mass lesion, hemorrhage or acute infarction. Vascular: No hyperdense vessel or unexpected calcification. Skull: Normal. Negative for fracture or focal lesion. Sinuses/Orbits: Chronic right maxillary sinusitis is noted. Other: None. IMPRESSION: Mild chronic ischemic white matter disease. No acute intracranial abnormality seen. Electronically Signed   By: Marijo Conception, M.D.   On: 03/12/2018 19:38   US Abdomen Limited  Result Date: 03/12/2018 CLINICAL DATA:  Elevated LFTs and bilirubin. EXAM: ULTRASOUND ABDOMEN LIMITED RIGHT UPPER QUADRANT COMPARISON:  CT of the abdomen and pelvis performed 11/24/2016 FINDINGS: Gallbladder: No gallstones or wall thickening visualized. No sonographic Murphy sign noted by sonographer. Common bile duct: Diameter: 0.5 cm, within normal limits in caliber. Liver: No focal lesion identified. Within normal limits in parenchymal echogenicity. Portal vein is patent on color Doppler imaging with normal direction of blood flow towards the liver. IMPRESSION: Unremarkable ultrasound of the right upper quadrant. Electronically Signed   By: Garald Balding M.D.   On: 03/12/2018 22:06      Assessment/Plan Principal Problem:   Traumatic rhabdomyolysis ([place]) Active Problems:   HTN (hypertension)   Memory deficit   Dementia with behavioral disturbance ([facility])   Elevated LFTs   Rhabdomyolysis   Dehydration    1. Rhabdomyolysis likely from secondary to recurrent falls -gently hydrate recheck CK levels. 2. Recurrent falls -check EKG monitor in telemetry get physical therapy consult.  X-ray pelvis is pending. 3. Dehydration with hypernatremia -we will keep patient on gentle hydration follow metabolic panel. 4. Elevated LFTs could be from rhabdomyolysis.  Swelling of the abdomen is unremarkable her abdomen appears benign.  Follow LFTs closely.  Check acute hepatitis panel. 5. Fever likely from UTI -follow cultures for now patient is on  ceftriaxone. 6. Hypertension we will keep patient on PRN IV hydralazine until we confirm home medications. 7. Dementia was started on Aricept.  Patient's home medication has to be verified and confirmed.    DVT  prophylaxis: Lovenox. Code Status: Full code. Family Communication: No family at the bedside. Disposition Plan: To be determined. Consults called: None. Admission status: Observation.   Rise Patience MD [facility] Pager [phone].  If 7PM-7AM, please contact night-coverage [email or web address] Password [email or web address]  03/13/2018, 3:30 AM

## 2018-03-13 NOTE — Progress Notes (Signed)
PROGRESS NOTE    CASONDRA GASCA  TMA:[id number] DOB: 06-Sep-1940 DOA: 03/12/2018 PCP: Lauree Chandler, NP    Brief Narrative: BRIGET SHAHEED is a 77 y.o. female with history of dementia frequent falls hypertension was brought to the ER after patient's family found the patient was on the floor.  Patient was refusing to come to the ER and patient was involuntarily committed.  Not sure how the patient fell.  But as per the notes of her primary care physician in the medical record showed the patient has been having frequent falls.  Patient states that she has been having more falls after her recent start of medication.  Records show that he was started on Aricept in September.  Patient also was found to be mildly febrile for which patient was given Tylenol.   ED Course: In the ER CT head is unremarkable.  Patient was mildly febrile.  UA shows features concerning for UTI.  Patient's lactate was elevated along with elevated LFTs and CK levels.  Patient was given fluid bolus for rhabdomyolysis and admitted for further management.  On my exam patient denies any pain or shortness of breath.  Is able to move all extremities but generally weak.  Assessment & Plan:   Principal Problem:   Traumatic rhabdomyolysis ([place]) Active Problems:   HTN (hypertension)   Memory deficit   Dementia with behavioral disturbance (HCC)   Elevated LFTs   Rhabdomyolysis   Dehydration   Pressure injury of skin  Rhabdomyolysis; mild;  Not taking enough oral intake.  Will continue with IV fluids, increase to 100/  Repeat labs in am.  CK 2,070----1,146  Recurrent fall;  PT consult.  Pelvis x ray; ordered.  CT head, no acute intracranial abnormalities.   Hypernatremia; related to hypovolemia  IV fluids.  Resolved.   Transaminases;  Follow hepatitis panel Trending down.  [place]; unremarkable.   UTI; fever;  Continue with ceftriaxone  Follow urine culture. Blood culture.   HTN;  Will consider resuming Norvasc.  Awaiting med rec.   Dementia on Aricept.  Confuse. Will check B 12 411, TSH 0.49.  Ammonia 35, normal.   Sacrum un-stageable wound, present on admission; wound care consult.    RN Pressure Injury Documentation: Pressure Injury 03/13/18 Unstageable - Full thickness tissue loss in which the base of the ulcer is covered by slough (yellow, tan, gray, green or brown) and/or eschar (tan, brown or black) in the wound bed. red wound bed with sq tissue showing in center (Active)  03/13/18 0245   Location: Sacrum  Location Orientation: Mid  Staging: Unstageable - Full thickness tissue loss in which the base of the ulcer is covered by slough (yellow, tan, gray, green or brown) and/or eschar (tan, brown or black) in the wound bed.  Wound Description (Comments): red wound bed with sq tissue showing in center and yellow slough on right side, unknown depth.  Present on Admission: Yes    Malnutrition Type:      Malnutrition Characteristics:      Nutrition Interventions:     Estimated body mass index is 19.69 kg/m as calculated from the following:   Height as of 02/03/18: 5\' 1"  (1.549 m).   Weight as of 02/03/18: 47.3 kg.   DVT prophylaxis: lovenox Code Status; full code.  Family Communication: no family at bedside.  Disposition Plan: needs PT, SW.   Consultants:   none    Procedures:   [place]; RUQ; no acute abnormalities.    Antimicrobials:  ceftriaxone    Subjective: Lying in bed, sleepy. I wake her up. She said leave me alone. Not now.    Objective: Vitals:   03/13/18 0303 03/13/18 0753 03/13/18 1146 03/13/18 1304  BP: (!) 163/91 134/71  (!) 155/85  Pulse: 64 68  78  Resp: 18 18  18   Temp: 98.7 F (37.1 C) 99.2 F (37.3 C)  98.1 F (36.7 C)  TempSrc: Oral Oral  Oral  SpO2: 100% 100% 97% 100%    Intake/Output Summary (Last 24 hours) at 03/13/2018 1342 Last data filed at 03/13/2018 1307 Gross per 24 hour  Intake 3932.58 ml  Output -  Net 3932.58 ml   There  were no vitals filed for this visit.  Examination:  General exam: Appears calm and comfortable  Respiratory system:  Respiratory effort normal. Cardiovascular system:  S 1, S 2  Gastrointestinal system: not assess  Central nervous system: sleepy  Extremities: no edema   Data Reviewed: I have personally reviewed following labs and imaging studies  CBC: Recent Labs  Lab 03/12/18 1951 03/13/18 0430  WBC 8.0 5.9  NEUTROABS 5.9 4.0  HGB 14.5 12.1  HCT 47.1* 39.2  MCV 89.5 89.5  PLT 318 053   Basic Metabolic Panel: Recent Labs  Lab 03/12/18 1951 03/13/18 0430  NA 146* 141  K 3.9 3.9  CL 106 108  CO2 31 26  GLUCOSE 162* 122*  BUN 29* 23  CREATININE 0.86 0.77  CALCIUM 9.9 8.5*   GFR: CrCl cannot be calculated (Unknown ideal weight.). Liver Function Tests: Recent Labs  Lab 03/12/18 1951 03/13/18 0430  AST 80* 55*  ALT 56* 42  ALKPHOS 58 47  BILITOT 1.5* 1.2  PROT 7.3 5.3*  ALBUMIN 4.1 3.1*   No results for input(s): LIPASE, AMYLASE in the last 168 hours. Recent Labs  Lab 03/13/18 1036  AMMONIA 35   Coagulation Profile: No results for input(s): INR, PROTIME in the last 168 hours. Cardiac Enzymes: Recent Labs  Lab 03/12/18 1950 03/12/18 1951 03/13/18 0025 03/13/18 0430  CKTOTAL 2,070* 2,126* 1,596* 1,146*   BNP (last 3 results) No results for input(s): PROBNP in the last 8760 hours. HbA1C: No results for input(s): HGBA1C in the last 72 hours. CBG: No results for input(s): GLUCAP in the last 168 hours. Lipid Profile: No results for input(s): CHOL, HDL, LDLCALC, TRIG, CHOLHDL, LDLDIRECT in the last 72 hours. Thyroid Function Tests: Recent Labs    03/13/18 0430  TSH 0.492   Anemia Panel: Recent Labs    03/13/18 1036  VITAMINB12 411   Sepsis Labs: Recent Labs  Lab 03/12/18 1959 03/13/18 0430 03/13/18 0750  LATICACIDVEN 2.92* 2.0* 1.6    Recent Results (from the past 240 hour(s))  Culture, blood (routine x 2)     Status: None  (Preliminary result)   Collection Time: 03/13/18  1:55 AM  Result Value Ref Range Status   Specimen Description   Final    BLOOD RIGHT ARM Performed at [facility] [address], [place], [place] [address]    Special Requests   Final    BOTTLES DRAWN AEROBIC AND ANAEROBIC Blood Culture adequate volume Performed at [facility] [address], [place], [place] [address]    Culture PENDING  Incomplete   Report Status PENDING  Incomplete         Radiology Studies: Ct Head Wo Contrast  Result Date: 03/12/2018 CLINICAL DATA:  Unwitnessed fall. EXAM: CT HEAD WITHOUT CONTRAST TECHNIQUE: Contiguous axial  images were obtained from the base of the skull through the vertex without intravenous contrast. COMPARISON:  CT scan of November 09, 2017. FINDINGS: Brain: Mild chronic ischemic white matter disease is noted. No mass effect or midline shift is noted. Ventricular size is within normal limits. There is no evidence of mass lesion, hemorrhage or acute infarction. Vascular: No hyperdense vessel or unexpected calcification. Skull: Normal. Negative for fracture or focal lesion. Sinuses/Orbits: Chronic right maxillary sinusitis is noted. Other: None. IMPRESSION: Mild chronic ischemic white matter disease. No acute intracranial abnormality seen. Electronically Signed   By: Marijo Conception, M.D.   On: 03/12/2018 19:38   US Abdomen Limited  Result Date: 03/12/2018 CLINICAL DATA:  Elevated LFTs and bilirubin. EXAM: ULTRASOUND ABDOMEN LIMITED RIGHT UPPER QUADRANT COMPARISON:  CT of the abdomen and pelvis performed 11/24/2016 FINDINGS: Gallbladder: No gallstones or wall thickening visualized. No sonographic Murphy sign noted by sonographer. Common bile duct: Diameter: 0.5 cm, within normal limits in caliber. Liver: No focal lesion identified. Within normal limits in parenchymal echogenicity. Portal vein is patent on color Doppler imaging with normal direction of blood flow towards the  liver. IMPRESSION: Unremarkable ultrasound of the right upper quadrant. Electronically Signed   By: Garald Balding M.D.   On: 03/12/2018 22:06        Scheduled Meds: . chlorhexidine  15 mL Mouth Rinse BID  . enoxaparin (LOVENOX) injection  40 mg Subcutaneous Q24H  . feeding supplement (ENSURE ENLIVE)  237 mL Oral BID BM  . mouth rinse  15 mL Mouth Rinse q12n4p   Continuous Infusions: . cefTRIAXone (ROCEPHIN)  IV 1 g (03/13/18 0431)  . dextrose 5 % and 0.9% NaCl 100 mL/hr at 03/13/18 0847     LOS: 0 days    Time spent: 35 minutes.     Elmarie Shiley, MD [facility] Pager [phone]  If 7PM-7AM, please contact night-coverage [email or web address] Password [email or web address] 03/13/2018, 1:42 PM

## 2018-03-13 NOTE — Progress Notes (Signed)
CSW called pt's son and updated her son at ph: [phone]. Pt's son requested he be called if pt remains overnight, that otherwise he will go to see her in the morning at her home.  Taxi Voucher with instructions provided to EPD.  Please reconsult if future social work needs arise.  CSW signing off, as social work intervention is no longer needed.  Alphonse Guild. Betsabe Iglesia, LCSW, LCAS, CSI Clinical Social Worker Ph: [phone]

## 2018-03-13 NOTE — Progress Notes (Signed)
PT Cancellation Note  Patient Details Name: KYLA DUFFY MRN: [id number] DOB: 1940-10-05   Cancelled Treatment:    Reason Eval/Treat Not Completed: Patient declined, states that she will walk but not right now. Unable to  Have patient mobilize this visit. Up with nursing to [facility].  Claretha Cooper 03/13/2018, 2:12 PM Tresa Endo PT Acute Rehabilitation Services Pager [phone] Office [phone]

## 2018-03-13 NOTE — ED Notes (Addendum)
Urine Culture sent with UA 03/12/18 at 22:21

## 2018-03-13 NOTE — Progress Notes (Signed)
Pt was lying in bed when I arrived. Her sitter said she had laid down to rest. When pt heard me, she said she wanted prayer. [facility] had prayer bedside of pt for which she was grateful.  Please page if additional support is needed. [place], [place]   03/13/18 1900  Clinical Encounter Type  Visited With Patient;Health care provider

## 2018-03-13 NOTE — Progress Notes (Signed)
Pt has become aggressive towards staff. Pt is not redirectable. Pt is out of bed, flailing canes at staff when staff trying to assist pt in ambulating. Pt is not steady, confused to place, time and situation. On call paged for PRN for agitation as pt is unsafe. Kizzie Ide, RN

## 2018-03-13 NOTE — Progress Notes (Addendum)
CSW met with pt and pt provided CSW with verbal permission to call her son Garnette Scheuermann at ph: [phone].  CSW called and left a HIPPA-compliant VM requesting a call back.  CSW provided the CSW's number as well as the EPD's number at [phone].  12:22 AM CSW received a call from pt's son who stated he would come by the pt's home on the morning of 11/10 if pt is D/C'd.    Pt's son asked about pt's IVC status and CSW stated pt is psychiatrically cleared and is medically cleared and the pt's IVC paperwork will be rescinded if and when pt is D/C'd.  Pt's son voiced understanding.  Pt's son stated that pt's POA will be informed by the pt's son on 11/10 so the pt's POA can check on the in the home once pt's D/C'd.  12:32 AM CSW updated by pt's EPD a lab is being re-checked and pt may be admitted for obs and may be D/C'd.  CSW will fill out a taxi voucher to leave with the pt's RN should pt be D/C'd.  2nd shift ED CSW will leave handoff for 1st shift ED CSW.  CSW will continue to follow for D/C needs.  Alphonse Guild. Laniece Hornbaker, LCSW, LCAS, CSI Clinical Social Worker Ph: [phone]

## 2018-03-13 NOTE — ED Provider Notes (Signed)
Care assumed from Dr. Rex Kras, patient with dementia found laying on the floor and combative, initial labs showing significantly elevated CK.  She was given IV hydration, and CK has decreased modestly, now 1596 (initial was 2126).  She will need to continue with aggressive IV hydration and monitor renal function.  Case is discussed with Dr. Hal Hope of [facility], who agrees to admit the patient.  Results for orders placed or performed during the hospital encounter of 03/12/18  Comprehensive metabolic panel  Result Value Ref Range   Sodium 146 (H) 135 - 145 mmol/L   Potassium 3.9 3.5 - 5.1 mmol/L   Chloride 106 98 - 111 mmol/L   CO2 31 22 - 32 mmol/L   Glucose, Bld 162 (H) 70 - 99 mg/dL   BUN 29 (H) 8 - 23 mg/dL   Creatinine, Ser 0.86 0.44 - 1.00 mg/dL   Calcium 9.9 8.9 - 10.3 mg/dL   Total Protein 7.3 6.5 - 8.1 g/dL   Albumin 4.1 3.5 - 5.0 g/dL   AST 80 (H) 15 - 41 U/L   ALT 56 (H) 0 - 44 U/L   Alkaline Phosphatase 58 38 - 126 U/L   Total Bilirubin 1.5 (H) 0.3 - 1.2 mg/dL   GFR calc non Af Amer >60 >60 mL/min   GFR calc Af Amer >60 >60 mL/min   Anion gap 9 5 - 15  Ethanol  Result Value Ref Range   Alcohol, Ethyl (B) <10 <10 mg/dL  CBC with Differential  Result Value Ref Range   WBC 8.0 4.0 - 10.5 K/uL   RBC 5.26 (H) 3.87 - 5.11 MIL/uL   Hemoglobin 14.5 12.0 - 15.0 g/dL   HCT 47.1 (H) 36.0 - 46.0 %   MCV 89.5 80.0 - 100.0 fL   MCH 27.6 26.0 - 34.0 pg   MCHC 30.8 30.0 - 36.0 g/dL   RDW 12.2 11.5 - 15.5 %   Platelets 318 150 - 400 K/uL   nRBC 0.0 0.0 - 0.2 %   Neutrophils Relative % 74 %   Neutro Abs 5.9 1.7 - 7.7 K/uL   Lymphocytes Relative 13 %   Lymphs Abs 1.1 0.7 - 4.0 K/uL   Monocytes Relative 12 %   Monocytes Absolute 1.0 0.1 - 1.0 K/uL   Eosinophils Relative 0 %   Eosinophils Absolute 0.0 0.0 - 0.5 K/uL   Basophils Relative 0 %   Basophils Absolute 0.0 0.0 - 0.1 K/uL   Immature Granulocytes 1 %   Abs Immature Granulocytes 0.04 0.00 - 0.07 K/uL  CK   Result Value Ref Range   Total CK 2,126 (H) 38 - 234 U/L  Urinalysis, Routine w reflex microscopic  Result Value Ref Range   Color, Urine AMBER (A) YELLOW   APPearance CLOUDY (A) CLEAR   Specific Gravity, Urine 1.023 1.005 - 1.030   pH 6.0 5.0 - 8.0   Glucose, UA NEGATIVE NEGATIVE mg/dL   Hgb urine dipstick LARGE (A) NEGATIVE   Bilirubin Urine NEGATIVE NEGATIVE   Ketones, ur 5 (A) NEGATIVE mg/dL   Protein, ur 100 (A) NEGATIVE mg/dL   Nitrite NEGATIVE NEGATIVE   Leukocytes, UA TRACE (A) NEGATIVE   RBC / HPF >50 (H) 0 - 5 RBC/hpf   WBC, UA 6-10 0 - 5 WBC/hpf   Bacteria, UA NONE SEEN NONE SEEN   Squamous Epithelial / LPF 0-5 0 - 5   Mucus PRESENT    Budding Yeast PRESENT   CK  Result Value Ref Range  Total CK 2,070 (H) 38 - 234 U/L  CK  Result Value Ref Range   Total CK 1,596 (H) 38 - 234 U/L  I-Stat CG4 Lactic Acid, ED  Result Value Ref Range   Lactic Acid, Venous 2.92 (HH) 0.5 - 1.9 mmol/L   Comment NOTIFIED PHYSICIAN    Ct Head Wo Contrast  Result Date: 03/12/2018 CLINICAL DATA:  Unwitnessed fall. EXAM: CT HEAD WITHOUT CONTRAST TECHNIQUE: Contiguous axial images were obtained from the base of the skull through the vertex without intravenous contrast. COMPARISON:  CT scan of November 09, 2017. FINDINGS: Brain: Mild chronic ischemic white matter disease is noted. No mass effect or midline shift is noted. Ventricular size is within normal limits. There is no evidence of mass lesion, hemorrhage or acute infarction. Vascular: No hyperdense vessel or unexpected calcification. Skull: Normal. Negative for fracture or focal lesion. Sinuses/Orbits: Chronic right maxillary sinusitis is noted. Other: None. IMPRESSION: Mild chronic ischemic white matter disease. No acute intracranial abnormality seen. Electronically Signed   By: Marijo Conception, M.D.   On: 03/12/2018 19:38   US Abdomen Limited  Result Date: 03/12/2018 CLINICAL DATA:  Elevated LFTs and bilirubin. EXAM: ULTRASOUND ABDOMEN  LIMITED RIGHT UPPER QUADRANT COMPARISON:  CT of the abdomen and pelvis performed 11/24/2016 FINDINGS: Gallbladder: No gallstones or wall thickening visualized. No sonographic Murphy sign noted by sonographer. Common bile duct: Diameter: 0.5 cm, within normal limits in caliber. Liver: No focal lesion identified. Within normal limits in parenchymal echogenicity. Portal vein is patent on color Doppler imaging with normal direction of blood flow towards the liver. IMPRESSION: Unremarkable ultrasound of the right upper quadrant. Electronically Signed   By: Garald Balding M.D.   On: 99/77/4142 39:53      Delora Fuel, MD 20/23/34 0140

## 2018-03-14 DIAGNOSIS — T796XXA Traumatic ischemia of muscle, initial encounter: Secondary | ICD-10-CM | POA: Diagnosis not present

## 2018-03-14 LAB — BASIC METABOLIC PANEL
ANION GAP: 5 (ref 5–15)
BUN: 22 mg/dL (ref 8–23)
CHLORIDE: 107 mmol/L (ref 98–111)
CO2: 26 mmol/L (ref 22–32)
Calcium: 8.3 mg/dL — ABNORMAL LOW (ref 8.9–10.3)
Creatinine, Ser: 0.84 mg/dL (ref 0.44–1.00)
GFR calc Af Amer: >60 mL/min (ref >60)
GLUCOSE: 134 mg/dL — AB (ref 70–99)
Potassium: 3.8 mmol/L (ref 3.5–5.1)
SODIUM: 138 mmol/L (ref 135–145)

## 2018-03-14 LAB — CK: CK TOTAL: 445 U/L — AB (ref 38–234)

## 2018-03-14 MED ORDER — SENNOSIDES-DOCUSATE SODIUM 8.6-50 MG PO TABS
1.0000 | ORAL_TABLET | Freq: Two times a day (BID) | ORAL | Status: DC
  Administered 2018-03-14 – 2018-03-18 (×9): 1 via ORAL
  Filled 2018-03-14 (×9): qty 1

## 2018-03-14 NOTE — Progress Notes (Signed)
OT Cancellation Note  Patient Details Name: Kimberly Mclean MRN: [id number] DOB: [birth year]   Cancelled Treatment:    Reason Eval/Treat Not Completed: Other (comment). RN and sitter in room, pt ate lunch and is now sleeping. RN reports pt doesn't respond very well when she is woken up. Will re-attempt eval at a later time.  [place], OTR/L Acute Rehab Services Pager [phone] Office [phone]     Almon Register 03/14/2018, 12:52 PM

## 2018-03-14 NOTE — Evaluation (Signed)
Physical Therapy Evaluation Patient Details Name: Kimberly Mclean MRN: [id number] DOB: [birth year] Today's Date: 03/14/2018   History of Present Illness   Kimberly Mclean is a 77 y.o. female with history of dementia frequent falls hypertension was brought to the ER after patient's family found the patient was on the floor. Pt was found to have rhabdomyolysis.  Clinical Impression  Pt admitted with above diagnosis. Pt currently with functional limitations due to the deficits listed below (see PT Problem List).  Pt will benefit from skilled PT to increase their independence and safety with mobility to allow discharge to the venue listed below.  Pt is not safe to be home alone at this time. She has poor safety awareness and is a high fall risk.  Recommend SNF.    Follow Up Recommendations SNF;Supervision/Assistance - 24 hour    Equipment Recommendations  None recommended by PT    Recommendations for Other Services       Precautions / Restrictions Precautions Precautions: Fall      Mobility  Bed Mobility Overal bed mobility: Needs Assistance Bed Mobility: Supine to Sit     Supine to sit: Min guard     General bed mobility comments: with some difficulty  Transfers Overall transfer level: Needs assistance Equipment used: Straight cane;Quad cane Transfers: Sit to/from [facility] Sit to Stand: Min assist Stand pivot transfers: Mod assist       General transfer comment: MOD A for SPT due to safety. Pt repeating " hold on to me and don't let me fall". Once in chair, needed MAX A to re-position self.  Ambulation/Gait             General Gait Details: Pt declined gait  Stairs            Wheelchair Mobility    Modified Rankin (Stroke Patients Only)       Balance Overall balance assessment: Needs assistance;History of Falls   Sitting balance-Leahy Scale: Fair     Standing balance support: Bilateral upper extremity supported Standing  balance-Leahy Scale: Poor Standing balance comment: Pt fearful in standing                             Pertinent Vitals/Pain Pain Assessment: No/denies pain    Home Living Family/patient expects to be discharged to:: Skilled nursing facility Living Arrangements: Alone               Additional Comments: Lives alone in 1 level home. She thinks there are 4 steps to enter    Prior Function Level of Independence: Independent with assistive device(s)         Comments: Amb with a cane in one hand and quad cane in the other.  She states her bottom is scratched up from crawling around on the floor. She was unable to state if this was just from recent fall, or if this happens more often.     Hand Dominance        Extremity/Trunk Assessment   Upper Extremity Assessment Upper Extremity Assessment: Defer to OT evaluation    Lower Extremity Assessment Lower Extremity Assessment: Generalized weakness(Increased LE flexion in standing)    Cervical / Trunk Assessment Cervical / Trunk Assessment: Kyphotic  Communication      Cognition Arousal/Alertness: Awake/alert Behavior During Therapy: WFL for tasks assessed/performed;Agitated;Restless Overall Cognitive Status: No family/caregiver present to determine baseline cognitive functioning(hx of dementia) Area of Impairment: Orientation;Attention;Memory;Following commands;Safety/judgement;Awareness;Problem solving  Orientation Level: Person;Place Current Attention Level: Focused Memory: Decreased recall of precautions;Decreased short-term memory Following Commands: Follows one step commands inconsistently Safety/Judgement: Decreased awareness of safety;Decreased awareness of deficits Awareness: Intellectual Problem Solving: Slow processing;Difficulty sequencing General Comments: Pt focuses on what she wants to do and difficult to re-direct her.  When attempt to re-direct, she gets agitated.       General Comments      Exercises     Assessment/Plan    PT Assessment Patient needs continued PT services  PT Problem List Decreased strength;Decreased activity tolerance;Decreased balance;Decreased range of motion;Decreased mobility;Decreased cognition;Decreased safety awareness       PT Treatment Interventions DME instruction;Gait training;Functional mobility training;Therapeutic exercise;Therapeutic activities;Patient/family education;Balance training    PT Goals (Current goals can be found in the Care Plan section)  Acute Rehab PT Goals Patient Stated Goal: Pt agreeable to get up to chair with encouragment. PT Goal Formulation: Patient unable to participate in goal setting Time For Goal Achievement: 03/28/18 Potential to Achieve Goals: Fair    Frequency Min 2X/week   Barriers to discharge Decreased caregiver support;Inaccessible home environment      Co-evaluation               AM-PAC PT "6 Clicks" Daily Activity  Outcome Measure Difficulty turning over in bed (including adjusting bedclothes, sheets and blankets)?: A Little Difficulty moving from lying on back to sitting on the side of the bed? : A Little Difficulty sitting down on and standing up from a chair with arms (e.g., wheelchair, bedside commode, etc,.)?: Unable Help needed moving to and from a bed to chair (including a wheelchair)?: A Lot   Help needed climbing 3-5 steps with a railing? : Total 6 Click Score: 10    End of Session Equipment Utilized During Treatment: Gait belt Activity Tolerance: Patient tolerated treatment well;Treatment limited secondary to agitation Patient left: in chair;with call bell/phone within reach;with nursing/sitter in room(sitter) Nurse Communication: Mobility status PT Visit Diagnosis: Unsteadiness on feet (R26.81)    Time: 3500-9381 PT Time Calculation (min) (ACUTE ONLY): 20 min   Charges:   PT Evaluation $PT Eval Low Complexity: 1 Low          Fancy Dunkley L.  Tamala Julian, [place] Pager [phone] 03/14/2018   Kimberly Mclean 03/14/2018, 11:46 AM

## 2018-03-14 NOTE — Progress Notes (Signed)
CSW aware patient recommended for SNF, CSW also aware patient is oriented only to self and has a diagnosis of dementia.  CSW contacted Enid Derry, who patient earlier stated was POA Enid Derry is the patient's sister in law and IVC'd the patient. Enid Derry is aware patient is at [facility] and is recommended to discharge to SNF. Acelynn Dejonge [phone]).  Enid Derry states patient's son, Garnette Scheuermann, is POA.  CSW attempted to reach son, [name] [phone]). Left voicemail requesting returned call.  Stephanie Acre, [place] Social Worker [phone]

## 2018-03-14 NOTE — Progress Notes (Addendum)
PROGRESS NOTE    Kimberly Mclean  NTI:[id number] DOB: [birth year] DOA: 03/12/2018 PCP: Lauree Chandler, NP    Brief Narrative: Kimberly Mclean is a 77 y.o. female with history of dementia frequent falls hypertension was brought to the ER after patient's family found the patient was on the floor.  Patient was refusing to come to the ER and patient was involuntarily committed.  Not sure how the patient fell.  But as per the notes of her primary care physician in the medical record showed the patient has been having frequent falls.  Patient states that she has been having more falls after her recent start of medication.  Records show that he was started on Aricept in September.  Patient also was found to be mildly febrile for which patient was given Tylenol.   ED Course: In the ER CT head is unremarkable.  Patient was mildly febrile.  UA shows features concerning for UTI.  Patient's lactate was elevated along with elevated LFTs and CK levels.  Patient was given fluid bolus for rhabdomyolysis and admitted for further management.  On my exam patient denies any pain or shortness of breath.  Is able to move all extremities but generally weak.  Assessment & Plan:   Principal Problem:   Traumatic rhabdomyolysis ([place]) Active Problems:   HTN (hypertension)   Memory deficit   Dementia with behavioral disturbance (HCC)   Elevated LFTs   Rhabdomyolysis   Dehydration   Pressure injury of skin  Rhabdomyolysis; mild;  Will continue with IV fluids. CK 2,070----1,146----400  Recurrent fall;  PT consult. Needs SNF Pelvis x ray; ordered.  CT head, no acute intracranial abnormalities.   Hypernatremia; related to hypovolemia  IV fluids.  Resolved.   Transaminases;  Follow hepatitis panel Trending down.  [place]; unremarkable.   UTI; fever;  Continue with ceftriaxone  Follow urine culture. Blood culture. Awaiting culture results.  Spike fever 11-09   HTN;  Will resume Norvasc.   Dementia on  Aricept.  Confuse.  B 12 411, TSH 0.49.  Ammonia 35, normal.   Sacrum un-stageable wound, present on admission; wound care consult.    RN Pressure Injury Documentation: Pressure Injury 03/13/18 Unstageable - Full thickness tissue loss in which the base of the ulcer is covered by slough (yellow, tan, gray, green or brown) and/or eschar (tan, brown or black) in the wound bed. red wound bed with sq tissue showing in center (Active)  03/13/18 0245   Location: Sacrum  Location Orientation: Mid  Staging: Unstageable - Full thickness tissue loss in which the base of the ulcer is covered by slough (yellow, tan, gray, green or brown) and/or eschar (tan, brown or black) in the wound bed.  Wound Description (Comments): red wound bed with sq tissue showing in center and yellow slough on right side, unknown depth.  Present on Admission: Yes    Malnutrition Type:      Malnutrition Characteristics:      Nutrition Interventions:     Estimated body mass index is 19.69 kg/m as calculated from the following:   Height as of 02/03/18: 5\' 1"  (1.549 m).   Weight as of 02/03/18: 47.3 kg.   DVT prophylaxis: lovenox Code Status; full code.  Family Communication: no family at bedside.  Disposition Plan: needs PT, SW.   Consultants:   none    Procedures:   [place]; RUQ; no acute abnormalities.    Antimicrobials:   ceftriaxone    Subjective: Less agitated, more cooperative. Confuse.  Objective: Vitals:   03/13/18 1750 03/13/18 1810 03/13/18 2142 03/14/18 0630  BP:   (!) 141/79 138/79  Pulse:   87 82  Resp:   18 16  Temp: 98.4 F (36.9 C) 99 F (37.2 C) 98.4 F (36.9 C) 99.3 F (37.4 C)  TempSrc: Oral Oral Oral Oral  SpO2:   100% 100%    Intake/Output Summary (Last 24 hours) at 03/14/2018 1359 Last data filed at 03/14/2018 0824 Gross per 24 hour  Intake 1673.19 ml  Output -  Net 1673.19 ml   There were no vitals filed for this visit.  Examination:  General exam:  NAD Respiratory system:  CTA Cardiovascular system: S 1, S 2 RRR Gastrointestinal system: BS present, soft, nt Central nervous system: alert, follows command. confuse Extremities: No edema  Data Reviewed: I have personally reviewed following labs and imaging studies  CBC: Recent Labs  Lab 03/12/18 1951 03/13/18 0430  WBC 8.0 5.9  NEUTROABS 5.9 4.0  HGB 14.5 12.1  HCT 47.1* 39.2  MCV 89.5 89.5  PLT 318 580   Basic Metabolic Panel: Recent Labs  Lab 03/12/18 1951 03/13/18 0430 03/14/18 0542  NA 146* 141 138  K 3.9 3.9 3.8  CL 106 108 107  CO2 31 26 26   GLUCOSE 162* 122* 134*  BUN 29* 23 22  CREATININE 0.86 0.77 0.84  CALCIUM 9.9 8.5* 8.3*   GFR: CrCl cannot be calculated (Unknown ideal weight.). Liver Function Tests: Recent Labs  Lab 03/12/18 1951 03/13/18 0430  AST 80* 55*  ALT 56* 42  ALKPHOS 58 47  BILITOT 1.5* 1.2  PROT 7.3 5.3*  ALBUMIN 4.1 3.1*   No results for input(s): LIPASE, AMYLASE in the last 168 hours. Recent Labs  Lab 03/13/18 1036  AMMONIA 35   Coagulation Profile: No results for input(s): INR, PROTIME in the last 168 hours. Cardiac Enzymes: Recent Labs  Lab 03/12/18 1950 03/12/18 1951 03/13/18 0025 03/13/18 0430 03/14/18 0542  CKTOTAL 2,070* 2,126* 1,596* 1,146* 445*   BNP (last 3 results) No results for input(s): PROBNP in the last 8760 hours. HbA1C: No results for input(s): HGBA1C in the last 72 hours. CBG: No results for input(s): GLUCAP in the last 168 hours. Lipid Profile: No results for input(s): CHOL, HDL, LDLCALC, TRIG, CHOLHDL, LDLDIRECT in the last 72 hours. Thyroid Function Tests: Recent Labs    03/13/18 0430  TSH 0.492   Anemia Panel: Recent Labs    03/13/18 1036  VITAMINB12 411   Sepsis Labs: Recent Labs  Lab 03/12/18 1959 03/13/18 0430 03/13/18 0750  LATICACIDVEN 2.92* 2.0* 1.6    Recent Results (from the past 240 hour(s))  Culture, blood (routine x 2)     Status: None (Preliminary result)    Collection Time: 03/13/18  1:55 AM  Result Value Ref Range Status   Specimen Description   Final    BLOOD RIGHT ARM Performed at [facility] [address] [place], [place] [address]    Special Requests   Final    BOTTLES DRAWN AEROBIC AND ANAEROBIC Blood Culture adequate volume Performed at [facility] [address] [place], [place] [address]    Culture   Final    NO GROWTH 1 DAY Performed at [facility], [place] [address] [place],  [address]    Report Status PENDING  Incomplete  Culture, blood (routine x 2)     Status: None (Preliminary result)   Collection Time: 03/13/18  2:10 AM  Result Value Ref Range Status   Specimen Description   Final    BLOOD LEFT HAND Performed at [facility] [address], [place], [place] [address]    Special Requests   Final    BOTTLES DRAWN AEROBIC AND ANAEROBIC Blood Culture adequate volume Performed at [facility] [address] [place], [place] [address]    Culture   Final    NO GROWTH 1 DAY Performed at [facility], [place] [address] [place], [place] [address]    Report Status PENDING  Incomplete         Radiology Studies: Ct Head Wo Contrast  Result Date: 03/12/2018 CLINICAL DATA:  Unwitnessed fall. EXAM: CT HEAD WITHOUT CONTRAST TECHNIQUE: Contiguous axial images were obtained from the base of the skull through the vertex without intravenous contrast. COMPARISON:  CT scan of November 09, 2017. FINDINGS: Brain: Mild chronic ischemic white matter disease is noted. No mass effect or midline shift is noted. Ventricular size is within normal limits. There is no evidence of mass lesion, hemorrhage or acute infarction. Vascular: No hyperdense vessel or unexpected calcification. Skull: Normal. Negative for fracture or focal lesion. Sinuses/Orbits: Chronic right maxillary sinusitis is noted. Other: None. IMPRESSION: Mild chronic ischemic white matter disease. No  acute intracranial abnormality seen. Electronically Signed   By: Marijo Conception, M.D.   On: 03/12/2018 19:38   US Abdomen Limited  Result Date: 03/12/2018 CLINICAL DATA:  Elevated LFTs and bilirubin. EXAM: ULTRASOUND ABDOMEN LIMITED RIGHT UPPER QUADRANT COMPARISON:  CT of the abdomen and pelvis performed 11/24/2016 FINDINGS: Gallbladder: No gallstones or wall thickening visualized. No sonographic Murphy sign noted by sonographer. Common bile duct: Diameter: 0.5 cm, within normal limits in caliber. Liver: No focal lesion identified. Within normal limits in parenchymal echogenicity. Portal vein is patent on color Doppler imaging with normal direction of blood flow towards the liver. IMPRESSION: Unremarkable ultrasound of the right upper quadrant. Electronically Signed   By: Garald Balding M.D.   On: 03/12/2018 22:06        Scheduled Meds: . chlorhexidine  15 mL Mouth Rinse BID  . enoxaparin (LOVENOX) injection  40 mg Subcutaneous Q24H  . feeding supplement (ENSURE ENLIVE)  237 mL Oral BID BM  . mouth rinse  15 mL Mouth Rinse q12n4p  . senna-docusate  1 tablet Oral BID   Continuous Infusions: . cefTRIAXone (ROCEPHIN)  IV 1 g (03/14/18 0550)  . dextrose 5 % and 0.9% NaCl 100 mL/hr at 03/14/18 0347     LOS: 0 days    Time spent: 35 minutes.     Elmarie Shiley, MD [facility] Pager [phone]  If 7PM-7AM, please contact night-coverage [email or web address] Password [email or web address] 03/14/2018, 1:59 PM

## 2018-03-15 DIAGNOSIS — T796XXA Traumatic ischemia of muscle, initial encounter: Secondary | ICD-10-CM | POA: Diagnosis not present

## 2018-03-15 LAB — URINE CULTURE

## 2018-03-15 LAB — HEPATITIS PANEL, ACUTE
HCV Ab: <0.1 s/co ratio (ref 0.0–0.9)
Hep A IgM: NEGATIVE
Hep B C IgM: NEGATIVE
Hepatitis B Surface Ag: NEGATIVE

## 2018-03-15 MED ORDER — PRO-STAT SUGAR FREE PO LIQD
30.0000 mL | Freq: Two times a day (BID) | ORAL | Status: DC
  Administered 2018-03-15 – 2018-03-18 (×6): 30 mL via ORAL
  Filled 2018-03-15 (×6): qty 30

## 2018-03-15 MED ORDER — ENSURE ENLIVE PO LIQD
237.0000 mL | Freq: Three times a day (TID) | ORAL | Status: DC
  Administered 2018-03-16 – 2018-03-18 (×6): 237 mL via ORAL

## 2018-03-15 MED ORDER — ALPRAZOLAM 0.25 MG PO TABS
0.2500 mg | ORAL_TABLET | Freq: Once | ORAL | Status: AC
  Administered 2018-03-15: 0.25 mg via ORAL
  Filled 2018-03-15 (×2): qty 1

## 2018-03-15 MED ORDER — HALOPERIDOL LACTATE 5 MG/ML IJ SOLN
2.5000 mg | Freq: Once | INTRAMUSCULAR | Status: AC
  Administered 2018-03-15: 2.5 mg via INTRAVENOUS
  Filled 2018-03-15: qty 1

## 2018-03-15 MED ORDER — ADULT MULTIVITAMIN W/MINERALS CH
1.0000 | ORAL_TABLET | Freq: Every day | ORAL | Status: DC
  Administered 2018-03-15 – 2018-03-18 (×4): 1 via ORAL
  Filled 2018-03-15 (×4): qty 1

## 2018-03-15 NOTE — Care Management Obs Status (Signed)
MEDICARE OBSERVATION STATUS NOTIFICATION   Patient Details  Name: Kimberly Mclean MRN: [id number] Date of Birth: 05/07/1940   Medicare Observation Status Notification Given:  Other (see comment) AMS and IVC'd at this time/patient did not want to sign or have papers   Leeroy Cha, RN 03/15/2018, 10:08 AM

## 2018-03-15 NOTE — NC FL2 (Signed)
[place] LEVEL OF CARE SCREENING TOOL     IDENTIFICATION  Patient Name: Kimberly Mclean Birthdate: 11-29-1940 Sex: female Admission Date (Current Location): 03/12/2018  [facility] and [place] Number:  Herbalist and Address:  [facility],  [place], [place]      Provider Number: [id number]  Attending Physician Name and Address:  Elmarie Shiley, MD  Relative Name and Phone Number:  Dedra Skeens [phone]    Current Level of Care: Hospital Recommended Level of Care: [place] Prior Approval Number:    Date Approved/Denied:   PASRR Number: [id number]  Discharge Plan: SNF    Current Diagnoses: Patient Active Problem List   Diagnosis Date Noted  . Acute encephalopathy 03/13/2018  . Traumatic rhabdomyolysis ([place]) 03/13/2018  . Dementia with behavioral disturbance ([place]) 03/13/2018  . Elevated LFTs 03/13/2018  . Rhabdomyolysis 03/13/2018  . Pressure injury of skin 03/13/2018  . Dehydration   . Carpal tunnel syndrome 12/08/2016  . Osteoporosis 10/14/2016  . Memory deficit 10/14/2016  . Diverticulosis 01/16/2012  . Hemorrhoids 01/16/2012  . HTN (hypertension) 01/16/2012    Orientation RESPIRATION BLADDER Height & Weight     Self  Normal Continent Weight:   Height:     BEHAVIORAL SYMPTOMS/MOOD NEUROLOGICAL BOWEL NUTRITION STATUS  Verbally abusive   Continent Diet(Regular)  AMBULATORY STATUS COMMUNICATION OF NEEDS Skin   Limited Assist Verbally Normal                       Personal Care Assistance Level of Assistance  Bathing, Feeding, Dressing Bathing Assistance: Limited assistance Feeding assistance: Independent Dressing Assistance: Limited assistance Total Care Assistance: Limited assistance   Functional Limitations Info  Sight, Hearing, Speech Sight Info: Impaired Hearing Info: Impaired Speech Info: Adequate    SPECIAL CARE FACTORS FREQUENCY  PT (By licensed PT), OT (By  licensed OT)     PT Frequency: Minimum 2x weekly OT Frequency: Minimum 2x weekly            Contractures Contractures Info: Not present    Additional Factors Info  Code Status, Allergies Code Status Info: Full Allergies Info: No known allergies           Current Medications (03/15/2018):  This is the current hospital active medication list Current Facility-Administered Medications  Medication Dose Route Frequency Provider Last Rate Last Dose  . acetaminophen (TYLENOL) tablet 650 mg  650 mg Oral Q6H PRN Rise Patience, MD   650 mg at 03/14/18 1124   Or  . acetaminophen (TYLENOL) suppository 650 mg  650 mg Rectal Q6H PRN Rise Patience, MD      . cefTRIAXone (ROCEPHIN) 1 g in sodium chloride 0.9 % 100 mL IVPB  1 g Intravenous Q0600 Rise Patience, MD 200 mL/hr at 03/15/18 0511 1 g at 03/15/18 0511  . chlorhexidine (PERIDEX) 0.12 % solution 15 mL  15 mL Mouth Rinse BID Rise Patience, MD   15 mL at 03/15/18 0936  . dextrose 5 %-0.9 % sodium chloride infusion   Intravenous Continuous Regalado, Belkys A, MD 100 mL/hr at 03/15/18 1332    . enoxaparin (LOVENOX) injection 40 mg  40 mg Subcutaneous Q24H Rise Patience, MD   40 mg at 03/15/18 0936  . feeding supplement (ENSURE ENLIVE) (ENSURE ENLIVE) liquid 237 mL  237 mL Oral BID BM Rise Patience, MD   237 mL at 03/15/18 1049  . MEDLINE mouth rinse  15 mL Mouth Rinse q12n4p Rise Patience, MD   15 mL at 03/13/18 1200  . ondansetron (ZOFRAN) tablet 4 mg  4 mg Oral Q6H PRN Rise Patience, MD       Or  . ondansetron [facility]) injection 4 mg  4 mg Intravenous Q6H PRN Rise Patience, MD      . senna-docusate (Senokot-S) tablet 1 tablet  1 tablet Oral BID Regalado, Belkys A, MD   1 tablet at 03/15/18 0935     Discharge Medications: Please see discharge summary for a list of discharge medications.  Relevant Imaging Results:  Relevant Lab Results:   Additional Information SSN:  [id number]  Joellen [place], [place]

## 2018-03-15 NOTE — Clinical Social Work Note (Signed)
Clinical Social Work Assessment  Patient Details  Name: Kimberly Mclean MRN: [id number] Date of Birth: 06-15-1940  Date of referral:  03/15/18               Reason for consult:  Facility Placement                Permission sought to share information with:  Facility Sport and exercise psychologist Permission granted to share information::  Yes, Verbal Permission Granted  Name::      Garnette Scheuermann, son  Agency::     Relationship::     Contact Information:    [phone]  Housing/Transportation Living arrangements for the past 2 months:  Single Family Home Source of Information:  Adult Children Patient Interpreter Needed:  None Criminal Activity/Legal Involvement Pertinent to Current Situation/Hospitalization:  No - Comment as needed Significant Relationships:  Adult Children, Other Family Members Lives with:  Self Do you feel safe going back to the place where you live?  No Need for family participation in patient care:  Yes (Comment)(Patient diagnosed with dementia and only oriented to self,)  Care giving concerns:  77 y.o.femalewithhistory of dementia frequent falls hypertension was brought to the ER after patient's family found the patient was on the floor.   Patient oriented only to self at baseline, PT recommending SNF.  Social Worker assessment / plan:   CSW aware patient not oriented and needs family participation in care and discharge planning.   CSW spoke with patient's sister in law, Enid Derry, who IVC'd patient prior to patient arriving in ED. Enid Derry was married to patient's deceased brother. Enid Derry believed that patient's son, [name], is patient's POA.  CSW attempted to reach son, [name], over the weekend and spoke with him today via phone. [name] is not patient's POA, but is her closest available family member. Patient reportedly had three children, one is deceased and the other is currently incarcerated.   [name] reports that the patient lives alone, approximately two blocks away from  his home. [name] works 6-7 days a week and is not able to provide the level of assistance to the patient that she needs at this time.   Fred agreeable to SNF and hoping to eventually move the patient into long term care.  CSW to start SNF referral process, will follow up with son regarding bed offers and then start insurance authorization.  Employment status:  Retired Research officer, political party) PT Recommendations:  [place] / Referral to community resources:  [place]  Patient/Family's Response to care:  Patient's son very appreciative of CSW intervention and care from hospital staff.  Patient/Family's Understanding of and Emotional Response to Diagnosis, Current Treatment, and Prognosis:   Patient's son agreeable to SNF, CSW made son aware SNF is short term and long term care arrangements would need to be pursued outside of the hospital.   Emotional Assessment Appearance:  Appears stated age Attitude/Demeanor/Rapport:  Unable to Assess Affect (typically observed):  Defensive, Restless Orientation:  Oriented to Self Alcohol / Substance use:  Not Applicable Psych involvement (Current and /or in the community):  No (Comment)  Discharge Needs  Concerns to be addressed:    Readmission within the last 30 days:  No Current discharge risk:  Lives alone, Cognitively Impaired Barriers to Discharge:  [place], [place] 03/15/2018, 1:22 PM

## 2018-03-15 NOTE — Progress Notes (Signed)
Initial Nutrition Assessment  DOCUMENTATION CODES:   Not applicable  INTERVENTION:  -Ensure Enlive po TID, each supplement provides 350 kcal and 20 grams of protein -Prostat BID, each supplement provides 100 kcal and 15 grams protein -Magic cup TID with meals, each supplement provides 290 kcal and 9 grams of protein -MVI   NUTRITION DIAGNOSIS:   Increased nutrient needs related to wound healing as evidenced by other (comment)(stage 3 pressure ulcer to sacrum).   GOAL:   Patient will meet greater than or equal to 90% of their needs  MONITOR:   PO intake, Supplement acceptance, Labs, Skin, Weight trends  REASON FOR ASSESSMENT:   Malnutrition Screening Tool    ASSESSMENT:  77 yo female w/ history of dementia, frequent falls, HTN presented to ED after being found on the floor by a family member. Stage 3 pressure ulcer on sacrum present on admission. Pt is recommended for discharge to SNF.  Pt not oriented at visit and not responding to RD presence in room. Per review of notes, pt is oriented only to self at baseline. No family in room at visit to obtain dietary history. Pt eating 90% of recorded meals.   D5 @ 155mL/hr providing 408kcals/day   Medications: Senokot, Medline mouth rinse, EE BID Labs: Glucose 134 (H) Ca 8.3 (L) w/out albumin for correction  NUTRITION - FOCUSED PHYSICAL EXAM: Unable to access d/t AMS   Most Recent Value  Orbital Region  Unable to assess [d/t pt altered mental status]  Upper Arm Region  Unable to assess  Thoracic and Lumbar Region  Unable to assess  Buccal Region  Unable to assess  Temple Region  Unable to assess  Clavicle Bone Region  Unable to assess  Clavicle and Acromion Bone Region  Unable to assess  Scapular Bone Region  Unable to assess  Dorsal Hand  Unable to assess  Patellar Region  Unable to assess  Anterior Thigh Region  Unable to assess  Posterior Calf Region  Unable to assess  Edema (RD Assessment)  None  Hair  Unable to  assess  Eyes  Unable to assess  Mouth  Unable to assess  Skin  Unable to assess  Nails  Unable to assess       Diet Order:   Diet Order            Diet Heart Room service appropriate? Yes; Fluid consistency: Thin  Diet effective now              EDUCATION NEEDS:   Not appropriate for education at this time  Skin:  Skin Assessment: Reviewed RN Assessment(stage3 pressure ulcer; sacrum)  Last BM:  03/14/18; type 4; large, brown  Height:   Ht Readings from Last 1 Encounters:  03/15/18 5\' 1"  (1.549 m)    Weight:   Wt Readings from Last 1 Encounters:  03/15/18 47.3 kg    Ideal Body Weight:  47.7 kg  BMI:  Body mass index is 19.7 kg/m.  Estimated Nutritional Needs:   Kcal:  2297-9892 (MSJ 1.25-1.4)  Protein:  66-80 grams (1.4-1.7g/kg)  Fluid:  1.1-1.2L    Lajuan Lines, RD, LDN  After Hours/Weekend Pager: [phone]

## 2018-03-15 NOTE — Consult Note (Signed)
[facility] Nurse wound consult note Reason for Consult: Stage 3 pressure injury to sacrum Wound type:pressure Pressure Injury POA: Yes Measurement: 4cm x 3.5cm x 0.2cm with 25% of wound depth obscured by the presence of yellow slough Wound bed:As described above Drainage (amount, consistency, odor) moderate amount of light yellow exudate Periwound:intact, dry Dressing procedure/placement/frequency: I have initiated a daily POC for the wound with a silver hydrofiber to absorb exudate and donate antimicrobial properties. The silicone foam topper will pad and protect the area somewhat, but she will benefit from routine positioning off of the affected area.A pressure redistribution chair cushion is provided for her use when OOB to chair.  [facility] nursing team will not follow, but will remain available to this patient, the nursing and medical teams.  Please re-consult if needed. Thanks, Maudie Flakes, MSN, RN, [place], Arther Abbott  Pager# [phone]

## 2018-03-15 NOTE — Progress Notes (Signed)
CSW following patient for discharge needs.  CSW notes two SNF beds offers: [facility] and [facility].   CSW tried to reach patient's son, Garnette Scheuermann, to review bed offers. Left Fred a [employer] the two facilities, should he wish to research them. Requested returned call for follow up. [place], [place] Worker [phone]

## 2018-03-15 NOTE — Progress Notes (Addendum)
CSW consulted regarding patient's IVC. CSW notes that patient was served an IVC, but a first opinion was never completed, thus the patient is not under IVC at this time. Informed nursing and attending physician.  CSW still attempting to reach patient's son and POA to assist in discharge planning. Patient recommended for SNF, but patient is not oriented.  POA and son, Garnette Scheuermann [phone]   Left son another voicemail at 12:59pm  Stephanie Acre, [place] Worker [phone]

## 2018-03-15 NOTE — Progress Notes (Addendum)
PROGRESS NOTE    Kimberly Mclean  AST:[id number] DOB: January 06, 1941 DOA: 03/12/2018 PCP: Lauree Chandler, NP    Brief Narrative: Kimberly Mclean is a 77 y.o. female with history of dementia frequent falls hypertension was brought to the ER after patient's family found the patient was on the floor.  Patient was refusing to come to the ER and patient was involuntarily committed.  Not sure how the patient fell.  But as per the notes of her primary care physician in the medical record showed the patient has been having frequent falls.  Patient states that she has been having more falls after her recent start of medication.  Records show that he was started on Aricept in September.  Patient also was found to be mildly febrile for which patient was given Tylenol.   ED Course: In the ER CT head is unremarkable.  Patient was mildly febrile.  UA shows features concerning for UTI.  Patient's lactate was elevated along with elevated LFTs and CK levels.  Patient was given fluid bolus for rhabdomyolysis and admitted for further management.  On my exam patient denies any pain or shortness of breath.  Is able to move all extremities but generally weak.  Assessment & Plan:   Principal Problem:   Traumatic rhabdomyolysis ([place]) Active Problems:   HTN (hypertension)   Memory deficit   Dementia with behavioral disturbance (HCC)   Elevated LFTs   Rhabdomyolysis   Dehydration   Pressure injury of skin  Rhabdomyolysis; mild;  Will continue with IV fluids. CK 2,070----1,146----400  Recurrent fall;  PT consult. Needs SNF Pelvis x ray; ordered.  CT head, no acute intracranial abnormalities.   Hypernatremia; related to hypovolemia  IV fluids.  Resolved.   Transaminases;  Follow hepatitis panel; negative  Trending down.  [place]; unremarkable.   UTI; fever;  Continue with ceftriaxone  Follow urine; 40,000 strep agalactiae.  culture. Blood culture; no growth to date.  Spike fever 11-09   HTN;  Will resume  Norvasc.   Dementia on Aricept.  Confuse.  B 12 411, TSH 0.49.  Ammonia 35, normal.   Sacrum stage III wound, present on admission; wound care consult.    RN Pressure Injury Documentation: Pressure Injury 03/13/18 Unstageable - Full thickness tissue loss in which the base of the ulcer is covered by slough (yellow, tan, gray, green or brown) and/or eschar (tan, brown or black) in the wound bed. red wound bed with sq tissue showing in center (Active)  03/13/18 0245   Location: Sacrum  Location Orientation: Mid  Staging: Unstageable - Full thickness tissue loss in which the base of the ulcer is covered by slough (yellow, tan, gray, green or brown) and/or eschar (tan, brown or black) in the wound bed.  Wound Description (Comments): red wound bed with sq tissue showing in center and yellow slough on right side, unknown depth.  Present on Admission: Yes    Malnutrition Type:  Nutrition Problem: Increased nutrient needs Etiology: wound healing   Malnutrition Characteristics:  Signs/Symptoms: other (comment)(stage 3 pressure ulcer to sacrum)   Nutrition Interventions:  Interventions: Ensure Enlive (each supplement provides 350kcal and 20 grams of protein), MVI, Prostat, Magic cup  Estimated body mass index is 19.7 kg/m as calculated from the following:   Height as of this encounter: 5\' 1"  (1.549 m).   Weight as of this encounter: 47.3 kg.   DVT prophylaxis: lovenox Code Status; full code.  Family Communication: no family at bedside.  Disposition Plan: Awaiting  SNF, SW trying to contact family   Consultants:   none    Procedures:   [place]; RUQ; no acute abnormalities.    Antimicrobials:   ceftriaxone    Subjective: Confuse. Denies pain    Objective: Vitals:   03/15/18 0435 03/15/18 0622 03/15/18 1345 03/15/18 1348  BP: (!) 152/73 (!) 155/75  (!) 166/95  Pulse: 85 81  79  Resp: 14 18  18   Temp: 99.2 F (37.3 C) 99.1 F (37.3 C)  98 F (36.7 C)  TempSrc:  Oral Oral    SpO2: 98% 99%  100%  Weight:   47.3 kg   Height:   5\' 1"  (1.549 m)     Intake/Output Summary (Last 24 hours) at 03/15/2018 1535 Last data filed at 03/15/2018 1111 Gross per 24 hour  Intake 417 ml  Output -  Net 417 ml   Filed Weights   03/15/18 1345  Weight: 47.3 kg    Examination:  General exam: NAD Respiratory system:  CTA Cardiovascular system: S 1, S 2 RRR Gastrointestinal system: BS present, sof,t  Central nervous system: confuse Extremities: No edema  Data Reviewed: I have personally reviewed following labs and imaging studies  CBC: Recent Labs  Lab 03/12/18 1951 03/13/18 0430  WBC 8.0 5.9  NEUTROABS 5.9 4.0  HGB 14.5 12.1  HCT 47.1* 39.2  MCV 89.5 89.5  PLT 318 315   Basic Metabolic Panel: Recent Labs  Lab 03/12/18 1951 03/13/18 0430 03/14/18 0542  NA 146* 141 138  K 3.9 3.9 3.8  CL 106 108 107  CO2 31 26 26   GLUCOSE 162* 122* 134*  BUN 29* 23 22  CREATININE 0.86 0.77 0.84  CALCIUM 9.9 8.5* 8.3*   GFR: Estimated Creatinine Clearance: 41.9 mL/min (by C-G formula based on SCr of 0.84 mg/dL). Liver Function Tests: Recent Labs  Lab 03/12/18 1951 03/13/18 0430  AST 80* 55*  ALT 56* 42  ALKPHOS 58 47  BILITOT 1.5* 1.2  PROT 7.3 5.3*  ALBUMIN 4.1 3.1*   No results for input(s): LIPASE, AMYLASE in the last 168 hours. Recent Labs  Lab 03/13/18 1036  AMMONIA 35   Coagulation Profile: No results for input(s): INR, PROTIME in the last 168 hours. Cardiac Enzymes: Recent Labs  Lab 03/12/18 1950 03/12/18 1951 03/13/18 0025 03/13/18 0430 03/14/18 0542  CKTOTAL 2,070* 2,126* 1,596* 1,146* 445*   BNP (last 3 results) No results for input(s): PROBNP in the last 8760 hours. HbA1C: No results for input(s): HGBA1C in the last 72 hours. CBG: No results for input(s): GLUCAP in the last 168 hours. Lipid Profile: No results for input(s): CHOL, HDL, LDLCALC, TRIG, CHOLHDL, LDLDIRECT in the last 72 hours. Thyroid Function  Tests: Recent Labs    03/13/18 0430  TSH 0.492   Anemia Panel: Recent Labs    03/13/18 1036  VITAMINB12 411   Sepsis Labs: Recent Labs  Lab 03/12/18 1959 03/13/18 0430 03/13/18 0750  LATICACIDVEN 2.92* 2.0* 1.6    Recent Results (from the past 240 hour(s))  Culture, Urine     Status: Abnormal   Collection Time: 03/12/18 10:21 PM  Result Value Ref Range Status   Specimen Description   Final    URINE, RANDOM Performed at [facility] [address] [place], [place] [address]    Special Requests   Final    NONE Performed at [facility], [place] [address], [place], [place] [address]    Culture (A)  Final  40,000 COLONIES/mL GROUP B STREP(S.AGALACTIAE)ISOLATED TESTING AGAINST S. AGALACTIAE NOT ROUTINELY PERFORMED DUE TO PREDICTABILITY OF AMP/PEN/VAN SUSCEPTIBILITY. 40,000 COLONIES/mL LACTOBACILLUS SPECIES Standardized susceptibility testing for this organism is not available. Performed at [facility], [place] [address] [place], [place] [address]    Report Status 03/15/2018 FINAL  Final  Culture, blood (routine x 2)     Status: None (Preliminary result)   Collection Time: 03/13/18  1:55 AM  Result Value Ref Range Status   Specimen Description   Final    BLOOD RIGHT ARM Performed at [facility], [place] [address], [place], [place] [address]    Special Requests   Final    BOTTLES DRAWN AEROBIC AND ANAEROBIC Blood Culture adequate volume Performed at [facility] [address] [place], [place] [address]    Culture   Final    NO GROWTH 2 DAYS Performed at [facility] [address], [place], [place] [address]    Report Status PENDING  Incomplete  Culture, blood (routine x 2)     Status: None (Preliminary result)   Collection Time: 03/13/18  2:10 AM  Result Value Ref Range Status   Specimen Description   Final    BLOOD LEFT HAND Performed at [facility]  [address] [place], [place] [address]    Special Requests   Final    BOTTLES DRAWN AEROBIC AND ANAEROBIC Blood Culture adequate volume Performed at [facility] [address], [place], [place] [address]    Culture   Final    NO GROWTH 2 DAYS Performed at [facility] [address], [place], [place] [address]    Report Status PENDING  Incomplete         Radiology Studies: No results found.      Scheduled Meds: . chlorhexidine  15 mL Mouth Rinse BID  . enoxaparin (LOVENOX) injection  40 mg Subcutaneous Q24H  . feeding supplement (ENSURE ENLIVE)  237 mL Oral TID BM  . feeding supplement (PRO-STAT SUGAR FREE 64)  30 mL Oral BID  . mouth rinse  15 mL Mouth Rinse q12n4p  . multivitamin with minerals  1 tablet Oral Daily  . senna-docusate  1 tablet Oral BID   Continuous Infusions: . cefTRIAXone (ROCEPHIN)  IV 1 g (03/15/18 0511)  . dextrose 5 % and 0.9% NaCl 100 mL/hr at 03/15/18 1332     LOS: 0 days    Time spent: 35 minutes.     Elmarie Shiley, MD [facility] Pager [phone]  If 7PM-7AM, please contact night-coverage [email or web address] Password [email or web address] 03/15/2018, 3:35 PM

## 2018-03-15 NOTE — Evaluation (Signed)
Occupational Therapy Evaluation Patient Details Name: Kimberly Mclean MRN: [id number] DOB: 08/19/1940 Today's Date: 03/15/2018    History of Present Illness  Kimberly Mclean is a 77 y.o. female with history of dementia frequent falls hypertension was brought to the ER after patient's family found the patient was on the floor. Pt was found to have rhabdomyolysis.   Clinical Impression   This 77 yo female admitted with above presents to acute OT with fear of falling, decreased balance, generalized weakness and decreased cognition thus affecting her safety and independence with basic ADLs. She will benefit from acute OT with follow up at SNF to work towards increased independence with basic ADLs.     Follow Up Recommendations  SNF;Supervision/Assistance - 24 hour    Equipment Recommendations  Other (comment)(TBD at next venue)       Precautions / Restrictions Precautions Precautions: Fall Restrictions Weight Bearing Restrictions: No      Mobility Bed Mobility Overal bed mobility: Needs Assistance Bed Mobility: Supine to Sit;Sit to Supine     Supine to sit: Min assist;HOB elevated(trunk) Sit to supine: Min assist(legs)      Transfers Overall transfer level: Needs assistance Equipment used: Rolling walker (2 wheeled) Transfers: Sit to/from Stand Sit to Stand: Min assist         General transfer comment: Min A ambulation 20 feet with RW    Balance Overall balance assessment: Needs assistance Sitting-balance support: No upper extremity supported;Feet supported Sitting balance-Leahy Scale: Fair     Standing balance support: Bilateral upper extremity supported Standing balance-Leahy Scale: Poor Standing balance comment: reliant on RW and additional external support                           ADL either performed or assessed with clinical judgement   ADL Overall ADL's : Needs assistance/impaired Eating/Feeding: Independent;Sitting   Grooming: Set  up;Supervision/safety Grooming Details (indicate cue type and reason): supported sitting Upper Body Bathing: Set up;Supervision/ safety Upper Body Bathing Details (indicate cue type and reason): supported sitting Lower Body Bathing: Maximal assistance Lower Body Bathing Details (indicate cue type and reason): min A sit<>stand Upper Body Dressing : Moderate assistance Upper Body Dressing Details (indicate cue type and reason): supported sitting Lower Body Dressing: Maximal assistance Lower Body Dressing Details (indicate cue type and reason): min A sit<>stand Toilet Transfer: Minimal assistance;Ambulation;RW Toilet Transfer Details (indicate cue type and reason): min A sit<>stand Toileting- Clothing Manipulation and Hygiene: Moderate assistance Toileting - Clothing Manipulation Details (indicate cue type and reason): min A sit<>stand             Vision Patient Visual Report: No change from baseline              Pertinent Vitals/Pain Pain Assessment: No/denies pain     Hand Dominance Right   Extremity/Trunk Assessment Upper Extremity Assessment Upper Extremity Assessment: Overall WFL for tasks assessed           Communication Communication Communication: No difficulties   Cognition Arousal/Alertness: Awake/alert Behavior During Therapy: WFL for tasks assessed/performed Overall Cognitive Status: No family/caregiver present to determine baseline cognitive functioning(per chart h/o dementia) Area of Impairment: Orientation;Following commands;Safety/judgement;Problem solving                 Orientation Level: (knew she was a [facility] knew her name and BD, but not today's date)     Following Commands: Follows one step commands consistently Safety/Judgement: Decreased awareness of safety;Decreased  awareness of deficits   Problem Solving: Difficulty sequencing;Requires verbal cues;Requires tactile cues                Home Living Family/patient  expects to be discharged to:: Skilled nursing facility Living Arrangements: Alone                               Additional Comments: Lives alone in 1 level home. She thinks there are 4 steps to enter      Prior Functioning/Environment Level of Independence: Independent with assistive device(s)        Comments: Amb with a cane in one hand and quad cane in the other.  She states her bottom is scratched up from crawling around on the floor. She was unable to state if this was just from recent fall, or if this happens more often.        OT Problem List: Decreased strength;Impaired balance (sitting and/or standing);Decreased cognition;Decreased knowledge of use of DME or AE      OT Treatment/Interventions: Self-care/ADL training;Balance training;DME and/or AE instruction;Patient/family education;Therapeutic activities    OT Goals(Current goals can be found in the care plan section) Acute Rehab OT Goals Patient Stated Goal: to not fall  OT Frequency: Min 2X/week   Barriers to D/C: Decreased caregiver support             AM-PAC PT "6 Clicks" Daily Activity     Outcome Measure Help from another person eating meals?: None Help from another person taking care of personal grooming?: A Little Help from another person toileting, which includes using toliet, bedpan, or urinal?: A Lot Help from another person bathing (including washing, rinsing, drying)?: A Lot Help from another person to put on and taking off regular upper body clothing?: A Lot Help from another person to put on and taking off regular lower body clothing?: A Lot 6 Click Score: 15   End of Session Equipment Utilized During Treatment: Gait belt;Rolling walker  Activity Tolerance: Patient tolerated treatment well Patient left: in bed;with call bell/phone within reach;with bed alarm set  OT Visit Diagnosis: Unsteadiness on feet (R26.81);Repeated falls (R29.6);History of falling (Z91.81);Muscle weakness  (generalized) (M62.81)                Time: 2956-2130 OT Time Calculation (min): 23 min Charges:  OT General Charges $OT Visit: 1 Visit OT Treatments $Self Care/Home Management : 8-22 mins  [address], OTR/L Acute [employer] Pager [phone] Office [phone]     Almon Register 03/15/2018, 4:28 PM

## 2018-03-16 DIAGNOSIS — T796XXA Traumatic ischemia of muscle, initial encounter: Secondary | ICD-10-CM | POA: Diagnosis not present

## 2018-03-16 DIAGNOSIS — F0391 Unspecified dementia with behavioral disturbance: Secondary | ICD-10-CM

## 2018-03-16 MED ORDER — ADULT MULTIVITAMIN W/MINERALS CH: 1.0000 | ORAL_TABLET | Freq: Every day | ORAL | 0 refills | Status: AC

## 2018-03-16 MED ORDER — SENNOSIDES-DOCUSATE SODIUM 8.6-50 MG PO TABS: 1.0000 | ORAL_TABLET | Freq: Two times a day (BID) | ORAL | 0 refills | Status: AC

## 2018-03-16 MED ORDER — ALPRAZOLAM 0.25 MG PO TABS
0.2500 mg | ORAL_TABLET | Freq: Once | ORAL | Status: AC
  Administered 2018-03-16: 0.25 mg via ORAL
  Filled 2018-03-16: qty 1

## 2018-03-16 MED ORDER — HYDRALAZINE HCL 20 MG/ML IJ SOLN
10.0000 mg | Freq: Once | INTRAMUSCULAR | Status: AC
  Administered 2018-03-16: 10 mg via INTRAVENOUS
  Filled 2018-03-16: qty 1

## 2018-03-16 MED ORDER — CEPHALEXIN 500 MG PO CAPS: 500.0000 mg | ORAL_CAPSULE | Freq: Two times a day (BID) | ORAL | 0 refills | Status: AC

## 2018-03-16 NOTE — Progress Notes (Signed)
Spoke with social worker Erin earlier, who stated that patient now waiting on prior authorization, which she thought could possibly be today, but if no authorization today might have to be tomorrow.

## 2018-03-16 NOTE — Discharge Summary (Addendum)
Physician Discharge Summary  LAURALYNN LOEB RKY:[id number] DOB: Sep 04, 1940 DOA: 03/12/2018  PCP: Lauree Chandler, NP  Admit date: 03/12/2018 Discharge date: 03/18/2018   This is an addendum to discharge summary done by Dr. Tyrell Antonio  on 11/12.  Admitted From: Home  Disposition:  SNF  Recommendations for Outpatient Follow-up:  1. Follow up with PCP in 1-2 weeks 2. Please obtain BMP/CBC in one week    Discharge Condition: Stable.  CODE STATUS: full code.  Diet recommendation: Heart Healthy  Brief/Interim Summary:  Kimberly Pixler Rorieis a 77 y.o.femalewithhistory of dementia frequent falls hypertension was brought to the ER after patient's family found the patient was on the floor. Patient was refusing to come to the ER and patient was involuntarily committed. Not sure how the patient fell. But as per the notes of her primary care physician in the medical record showed the patient has been having frequent falls. Patient states that she has been having more falls after her recent start of medication. Records show that he was started on Aricept in September. Patient also was found to be mildly febrile for which patient was given Tylenol.   ED Course:In the ER CT head is unremarkable. Patient was mildly febrile. UA shows features concerning for UTI. Patient's lactate was elevated along with elevated LFTs and CK levels. Patient was given fluid bolus for rhabdomyolysis and admitted for further management. On my exam patient denies any pain or shortness of breath. Is able to move all extremities but generally weak.   Hospital course   Principal Problem:   Traumatic rhabdomyolysis ([facility])  Active Problems:   HTN (hypertension)   Memory deficit   Dementia with behavioral disturbance (HCC)   Elevated LFTs   Rhabdomyolysis   Dehydration   Pressure injury of skin    Rhabdomyolysis; mild;  Improved with IV fluids.  CK of 2070 on presentation, improved to 400.  Recurrent  fall;  Head CT and x-ray of the pelvis unremarkable for acute injury.  Seen by PT and recommend SNF.    Hypernatremia;  Secondary to hyponatremia.  Improved with fluid.  Transaminases;  Improving.  Hepatitis panel negative.  Ultrasound abdomen unremarkable.  UTI Received empiric IV Rocephin followed by oral Keflex, 5-day course completed. Urine culture growing 40,000 strep agalactiae.    Blood cultures negative.  HTN;   resume Norvasc.   Dementia Has sundowning.  B12, TSH and ammonia normal.  Continue Aricept.  Sacrum stage III wound,  Present on admission.  Care per nursing.  Wound care consult appreciated.   Family communication: None at bedside Disposition: SNF Procedure: Head CT     Discharge Instructions  Discharge Instructions    Diet - low sodium heart healthy   Complete by:  As directed    Increase activity slowly   Complete by:  As directed      Allergies as of 03/16/2018   No Known Allergies     Medication List    STOP taking these medications   losartan 100 MG tablet Commonly known as:  COZAAR   predniSONE 5 MG tablet Commonly known as:  DELTASONE   pregabalin 50 MG capsule Commonly known as:  LYRICA   solifenacin 5 MG tablet Commonly known as:  VESICARE     TAKE these medications   acetaminophen 325 MG tablet Commonly known as:  TYLENOL Take 650 mg by mouth every 6 (six) hours as needed.   amLODipine 2.5 MG tablet Commonly known as:  NORVASC Take 1 tablet (2.5  mg total) by mouth daily.   cephALEXin 500 MG capsule Commonly known as:  KEFLEX Take 1 capsule (500 mg total) by mouth 2 (two) times daily for 3 days.   cholecalciferol 1000 units tablet Commonly known as:  VITAMIN D Take 1 tablet (1,000 Units total) by mouth daily.   donepezil 5 MG tablet Commonly known as:  ARICEPT Take 1 tablet (5 mg total) by mouth at bedtime. For memory loss   dorzolamide-timolol 22.3-6.8 MG/ML ophthalmic solution Commonly known as:   COSOPT INSTILL 1 DROP INTO BOTH EYES TWICE DAILY   latanoprost 0.005 % ophthalmic solution Commonly known as:  XALATAN INSTILL 1 DROP INTO BOTH EYES AT BEDTIME   multivitamin with minerals Tabs tablet Take 1 tablet by mouth daily. Start taking on:  03/17/2018   senna-docusate 8.6-50 MG tablet Commonly known as:  Senokot-S Take 1 tablet by mouth 2 (two) times daily.       No Known Allergies  Consultations: none  Procedures/Studies: Ct Head Wo Contrast  Result Date: 03/12/2018 CLINICAL DATA:  Unwitnessed fall. EXAM: CT HEAD WITHOUT CONTRAST TECHNIQUE: Contiguous axial images were obtained from the base of the skull through the vertex without intravenous contrast. COMPARISON:  CT scan of November 09, 2017. FINDINGS: Brain: Mild chronic ischemic white matter disease is noted. No mass effect or midline shift is noted. Ventricular size is within normal limits. There is no evidence of mass lesion, hemorrhage or acute infarction. Vascular: No hyperdense vessel or unexpected calcification. Skull: Normal. Negative for fracture or focal lesion. Sinuses/Orbits: Chronic right maxillary sinusitis is noted. Other: None. IMPRESSION: Mild chronic ischemic white matter disease. No acute intracranial abnormality seen. Electronically Signed   By: Marijo Conception, M.D.   On: 03/12/2018 19:38   US Abdomen Limited  Result Date: 03/12/2018 CLINICAL DATA:  Elevated LFTs and bilirubin. EXAM: ULTRASOUND ABDOMEN LIMITED RIGHT UPPER QUADRANT COMPARISON:  CT of the abdomen and pelvis performed 11/24/2016 FINDINGS: Gallbladder: No gallstones or wall thickening visualized. No sonographic Murphy sign noted by sonographer. Common bile duct: Diameter: 0.5 cm, within normal limits in caliber. Liver: No focal lesion identified. Within normal limits in parenchymal echogenicity. Portal vein is patent on color Doppler imaging with normal direction of blood flow towards the liver. IMPRESSION: Unremarkable ultrasound of the right  upper quadrant. Electronically Signed   By: Garald Balding M.D.   On: 03/12/2018 22:06     Subjective: Episode of sundowning during the evening.  No other issue.  Discharge Exam: Vitals:   03/16/18 0431 03/16/18 0543  BP: (!) 168/104 (!) 146/76  Pulse: 69   Resp: 20   Temp: 98.5 F (36.9 C)   SpO2: 100%    Vitals:   03/15/18 1348 03/15/18 2046 03/16/18 0431 03/16/18 0543  BP: (!) 166/95 (!) 148/83 (!) 168/104 (!) 146/76  Pulse: 79 81 69   Resp: 18 19 20    Temp: 98 F (36.7 C) 98.3 F (36.8 C) 98.5 F (36.9 C)   TempSrc:      SpO2: 100% 100% 100%   Weight:   48.1 kg   Height:        Vitals on 11/14 at 7:35 AM Temperature 98.9 F, heart rate 84, regular, respiratory rate 18, blood pressure 149/71 mmHg, O2 sat 98% on room air  General: Not in distress HEENT: No pallor, moist mucosa, supple neck Chest: Clear bilaterally CVs: Normal S1 and S2, no murmurs GI: Soft, nondistended, nontender Musculoskeletal: Warm, no edema CNS: Alert and oriented x2-3  The results of significant diagnostics from this hospitalization (including imaging, microbiology, ancillary and laboratory) are listed below for reference.     Microbiology: Recent Results (from the past 240 hour(s))  Culture, Urine     Status: Abnormal   Collection Time: 03/12/18 10:21 PM  Result Value Ref Range Status   Specimen Description   Final    URINE, RANDOM Performed at [facility] [address], [place], [place] [address]    Special Requests   Final    NONE Performed at [facility], [place] [address] [place], [place] [address]    Culture (A)  Final    40,000 COLONIES/mL GROUP B STREP(S.AGALACTIAE)ISOLATED TESTING AGAINST S. AGALACTIAE NOT ROUTINELY PERFORMED DUE TO PREDICTABILITY OF AMP/PEN/VAN SUSCEPTIBILITY. 40,000 COLONIES/mL LACTOBACILLUS SPECIES Standardized susceptibility testing for this organism is not available. Performed at [facility], [place] [address] [place], [place] [address]    Report Status 03/15/2018 FINAL  Final  Culture, blood (routine x 2)     Status: None (Preliminary result)   Collection Time: 03/13/18  1:55 AM  Result Value Ref Range Status   Specimen Description   Final    BLOOD RIGHT ARM Performed at [facility], [place] [address], [place], [place] [address]    Special Requests   Final    BOTTLES DRAWN AEROBIC AND ANAEROBIC Blood Culture adequate volume Performed at [facility] [address] [place], [place] [address]    Culture   Final    NO GROWTH 2 DAYS Performed at [facility] [address], [place], [place] [address]    Report Status PENDING  Incomplete  Culture, blood (routine x 2)     Status: None (Preliminary result)   Collection Time: 03/13/18  2:10 AM  Result Value Ref Range Status   Specimen Description   Final    BLOOD LEFT HAND Performed at [facility] [address], [place], [place] [address]    Special Requests   Final    BOTTLES DRAWN AEROBIC AND ANAEROBIC Blood Culture adequate volume Performed at [facility] [address] [place], [place] [address]    Culture   Final    NO GROWTH 2 DAYS Performed at [facility] [address] [place], [place] [address]    Report Status PENDING  Incomplete     Labs: BNP (last 3 results) No results for input(s): BNP in the last 8760 hours. Basic Metabolic Panel: Recent Labs  Lab 03/12/18 1951 03/13/18 0430 03/14/18 0542  NA 146* 141 138  K 3.9 3.9 3.8  CL 106 108 107  CO2 31 26 26   GLUCOSE 162* 122* 134*  BUN 29* 23 22  CREATININE 0.86 0.77 0.84  CALCIUM 9.9 8.5* 8.3*   Liver Function Tests: Recent Labs  Lab 03/12/18 1951 03/13/18 0430  AST 80* 55*  ALT 56* 42  ALKPHOS 58 47  BILITOT 1.5* 1.2  PROT 7.3 5.3*  ALBUMIN 4.1 3.1*   No results for input(s): LIPASE, AMYLASE in the last 168 hours. Recent Labs  Lab 03/13/18 1036   AMMONIA 35   CBC: Recent Labs  Lab 03/12/18 1951 03/13/18 0430  WBC 8.0 5.9  NEUTROABS 5.9 4.0  HGB 14.5 12.1  HCT 47.1* 39.2  MCV 89.5 89.5  PLT 318 264   Cardiac Enzymes: Recent Labs  Lab 03/12/18 1950 03/12/18 1951 03/13/18 0025 03/13/18 0430 03/14/18 0542  CKTOTAL 2,070* 2,126*  1,596* 1,146* 445*   BNP: Invalid input(s): POCBNP CBG: No results for input(s): GLUCAP in the last 168 hours. D-Dimer No results for input(s): DDIMER in the last 72 hours. Hgb A1c No results for input(s): HGBA1C in the last 72 hours. Lipid Profile No results for input(s): CHOL, HDL, LDLCALC, TRIG, CHOLHDL, LDLDIRECT in the last 72 hours. Thyroid function studies No results for input(s): TSH, T4TOTAL, T3FREE, THYROIDAB in the last 72 hours.  Invalid input(s): FREET3 Anemia work up No results for input(s): VITAMINB12, FOLATE, FERRITIN, TIBC, IRON, RETICCTPCT in the last 72 hours. Urinalysis    Component Value Date/Time   COLORURINE AMBER (A) 03/12/2018 2221   APPEARANCEUR CLOUDY (A) 03/12/2018 2221   LABSPEC 1.023 03/12/2018 2221   PHURINE 6.0 03/12/2018 2221   GLUCOSEU NEGATIVE 03/12/2018 2221   HGBUR LARGE (A) 03/12/2018 2221   BILIRUBINUR NEGATIVE 03/12/2018 2221   BILIRUBINUR neg 09/01/2016 1155   KETONESUR 5 (A) 03/12/2018 2221   PROTEINUR 100 (A) 03/12/2018 2221   UROBILINOGEN 0.2 09/01/2016 1155   UROBILINOGEN 0.2 10/03/2012 1237   NITRITE NEGATIVE 03/12/2018 2221   LEUKOCYTESUR TRACE (A) 03/12/2018 2221   Sepsis Labs Invalid input(s): PROCALCITONIN,  WBC,  LACTICIDVEN Microbiology Recent Results (from the past 240 hour(s))  Culture, Urine     Status: Abnormal   Collection Time: 03/12/18 10:21 PM  Result Value Ref Range Status   Specimen Description   Final    URINE, RANDOM Performed at [facility], [place] [address], [place], [place] [address]    Special Requests   Final    NONE Performed at [facility], [place] [address], [place], [place] [address]    Culture (A)  Final    40,000 COLONIES/mL GROUP B STREP(S.AGALACTIAE)ISOLATED TESTING AGAINST S. AGALACTIAE NOT ROUTINELY PERFORMED DUE TO PREDICTABILITY OF AMP/PEN/VAN SUSCEPTIBILITY. 40,000 COLONIES/mL LACTOBACILLUS SPECIES Standardized susceptibility testing for this organism is not available. Performed at [facility], [place] [address], [place], [place] [address]    Report Status 03/15/2018 FINAL  Final  Culture, blood (routine x 2)     Status: None (Preliminary result)   Collection Time: 03/13/18  1:55 AM  Result Value Ref Range Status   Specimen Description   Final    BLOOD RIGHT ARM Performed at [facility], [place] [address] [place], [place] [address]    Special Requests   Final    BOTTLES DRAWN AEROBIC AND ANAEROBIC Blood Culture adequate volume Performed at [facility] [address] [place], [place] [address]    Culture   Final    NO GROWTH 2 DAYS Performed at [facility] [address] [place] [address] [place], [place] [address]    Report Status PENDING  Incomplete  Culture, blood (routine x 2)     Status: None (Preliminary result)   Collection Time: 03/13/18  2:10 AM  Result Value Ref Range Status   Specimen Description   Final    BLOOD LEFT HAND Performed at [facility] [address] [place], [place] [address]    Special Requests   Final    BOTTLES DRAWN AEROBIC AND ANAEROBIC Blood Culture adequate volume Performed at [facility] [address], [place], [place] [address]    Culture   Final    NO GROWTH 2 DAYS Performed at [facility] [address] [place], [place] [address]    Report Status PENDING  Incomplete     Time coordinating  discharge: 35 minutes.   SIGNED:   Elmarie Shiley, MD  [facility] 03/16/2018, 12:41 PM Pager   If 7PM-7AM, please contact night-coverage [email or web address] Password [email or web address]

## 2018-03-16 NOTE — Progress Notes (Signed)
Patient medically cleared, but telemetry order still in.  Patient currently refusing to be put back on telemetry.

## 2018-03-17 DIAGNOSIS — T796XXD Traumatic ischemia of muscle, subsequent encounter: Secondary | ICD-10-CM | POA: Diagnosis not present

## 2018-03-17 MED ORDER — QUETIAPINE FUMARATE 25 MG PO TABS
25.0000 mg | ORAL_TABLET | Freq: Every day | ORAL | Status: DC
  Administered 2018-03-17: 25 mg via ORAL
  Filled 2018-03-17: qty 1

## 2018-03-17 MED ORDER — LORAZEPAM 2 MG/ML IJ SOLN
1.0000 mg | Freq: Once | INTRAMUSCULAR | Status: AC
  Administered 2018-03-18: 1 mg via INTRAVENOUS
  Filled 2018-03-17: qty 1

## 2018-03-17 MED ORDER — LORAZEPAM 2 MG/ML IJ SOLN: 1.0000 mg | Freq: Once | INTRAMUSCULAR | Status: DC

## 2018-03-17 MED ORDER — CEPHALEXIN 500 MG PO CAPS
500.0000 mg | ORAL_CAPSULE | Freq: Two times a day (BID) | ORAL | Status: DC
  Administered 2018-03-17 (×2): 500 mg via ORAL
  Filled 2018-03-17 (×2): qty 1

## 2018-03-17 NOTE — Progress Notes (Addendum)
PROGRESS NOTE                                                                                                                                                                                                             Patient Demographics:    Kimberly Mclean, is a 77 y.o. female, DOB - 26-Nov-1940, JOI:[id number]  Admit date - 03/12/2018   Admitting Physician Rise Patience, MD  Outpatient Primary MD for the patient is Lauree Chandler, NP  LOS - 0  Outpatient Specialists: None  Chief Complaint  Patient presents with  . Altered Mental Status  . IVC       Brief Narrative 77 year old female with dementia, frequent falls at home and hypertension brought in by family to the ED after she was found on the floor.  Patient refused to come to the ED and was involuntarily committed.  In the ED head CT was unremarkable UA suggestive of UTI, had low-grade fever and elevated lactic acid along with elevated LFTs and CK.  Patient given fluid bolus followed by maintenance fluid for rhabdomyolysis and observed for further management.   Subjective:   Denies any symptoms today.  No overnight events.   Assessment  & Plan :    Principal Problem:   Traumatic rhabdomyolysis ([facility]) No clear etiology.  Was on the floor for unknown time.  CK of 2000 on presentation, now improved to 400 with fluids.  Active Problems: Recurrent falls Seen by PT who recommended SNF.  No signs of injury on imaging.  Hypernatremia Secondary to hypovolemia.  Resolved with fluids  Transaminitis Ultrasound abdomen and hepatitis panel unremarkable.  Improved and subsequent labs.  UTI Culture growing 40,000 strep .  Blood culture negative.  Treated with 5 days of antibiotics.     Dementia with behavioral disturbance (HCC) Currently stable.  Continue Aricept.  Normal TSH, B12 and ammonia.    Pressure injury of skin, stage III sacral wound Care per  nursing.  Essential hypertension Stable.  Continue Norvasc.     Code Status : Full code  Family Communication  : None at bedside  Disposition Plan  : SNF  Barriers For Discharge : Awaiting insurance Auth for SNF  Consults  : None  Procedures  : CT head  DVT Prophylaxis  :  Lovenox  Lab Results  Component Value Date  PLT 264 03/13/2018    Antibiotics  :    Anti-infectives (From admission, onward)   Start     Dose/Rate Route Frequency Ordered Stop   03/17/18 1000  cephALEXin (KEFLEX) capsule 500 mg     500 mg Oral Every 12 hours 03/17/18 0956 03/19/18 0959   03/16/18 0000  cephALEXin (KEFLEX) 500 MG capsule     500 mg Oral 2 times daily 03/16/18 1240 03/19/18 2359   03/13/18 0345  cefTRIAXone (ROCEPHIN) 1 g in sodium chloride 0.9 % 100 mL IVPB  Status:  Discontinued     1 g 200 mL/hr over 30 Minutes Intravenous Daily 03/13/18 0332 03/17/18 0956        Objective:   Vitals:   03/16/18 0543 03/16/18 1400 03/16/18 2050 03/17/18 0510  BP: (!) 146/76 (!) 163/94 (!) 145/97 (!) 150/90  Pulse:   (!) 109 79  Resp:  16 18 20   Temp:   98.8 F (37.1 C) 99.2 F (37.3 C)  TempSrc:      SpO2:  99% 100% 100%  Weight:      Height:        Wt Readings from Last 3 Encounters:  03/16/18 48.1 kg  02/03/18 47.3 kg  01/20/18 48.5 kg     Intake/Output Summary (Last 24 hours) at 03/17/2018 1620 Last data filed at 03/17/2018 1300 Gross per 24 hour  Intake 700 ml  Output -  Net 700 ml     Physical Exam  Gen: not in distress HEENT: moist mucosa, supple neck Chest: clear b/l, no added sounds CVS: N S1&S2, no murmurs,  GI: soft, NT, ND,  Musculoskeletal: warm, no edema     Data Review:    CBC Recent Labs  Lab 03/12/18 1951 03/13/18 0430  WBC 8.0 5.9  HGB 14.5 12.1  HCT 47.1* 39.2  PLT 318 264  MCV 89.5 89.5  MCH 27.6 27.6  MCHC 30.8 30.9  RDW 12.2 12.3  LYMPHSABS 1.1 0.9  MONOABS 1.0 0.8  EOSABS 0.0 0.1  BASOSABS 0.0 0.0    Chemistries  Recent  Labs  Lab 03/12/18 1951 03/13/18 0430 03/14/18 0542  NA 146* 141 138  K 3.9 3.9 3.8  CL 106 108 107  CO2 31 26 26   GLUCOSE 162* 122* 134*  BUN 29* 23 22  CREATININE 0.86 0.77 0.84  CALCIUM 9.9 8.5* 8.3*  AST 80* 55*  --   ALT 56* 42  --   ALKPHOS 58 47  --   BILITOT 1.5* 1.2  --    ------------------------------------------------------------------------------------------------------------------ No results for input(s): CHOL, HDL, LDLCALC, TRIG, CHOLHDL, LDLDIRECT in the last 72 hours.  Lab Results  Component Value Date   HGBA1C 5.3 11/17/2014   ------------------------------------------------------------------------------------------------------------------ No results for input(s): TSH, T4TOTAL, T3FREE, THYROIDAB in the last 72 hours.  Invalid input(s): FREET3 ------------------------------------------------------------------------------------------------------------------ No results for input(s): VITAMINB12, FOLATE, FERRITIN, TIBC, IRON, RETICCTPCT in the last 72 hours.  Coagulation profile No results for input(s): INR, PROTIME in the last 168 hours.  No results for input(s): DDIMER in the last 72 hours.  Cardiac Enzymes No results for input(s): CKMB, TROPONINI, MYOGLOBIN in the last 168 hours.  Invalid input(s): CK ------------------------------------------------------------------------------------------------------------------ No results found for: BNP  Inpatient Medications  Scheduled Meds: . cephALEXin  500 mg Oral Q12H  . chlorhexidine  15 mL Mouth Rinse BID  . enoxaparin (LOVENOX) injection  40 mg Subcutaneous Q24H  . feeding supplement (ENSURE ENLIVE)  237 mL Oral TID BM  . feeding supplement (PRO-STAT  SUGAR FREE 64)  30 mL Oral BID  . mouth rinse  15 mL Mouth Rinse q12n4p  . multivitamin with minerals  1 tablet Oral Daily  . senna-docusate  1 tablet Oral BID   Continuous Infusions: PRN Meds:.acetaminophen **OR** acetaminophen, ondansetron **OR**  ondansetron (ZOFRAN) IV  Micro Results Recent Results (from the past 240 hour(s))  Culture, Urine     Status: Abnormal   Collection Time: 03/12/18 10:21 PM  Result Value Ref Range Status   Specimen Description   Final    URINE, RANDOM Performed at [facility] [address], [place], [place] [address]    Special Requests   Final    NONE Performed at [facility], [place] [address], [place], [place] [address]    Culture (A)  Final    40,000 COLONIES/mL GROUP B STREP(S.AGALACTIAE)ISOLATED TESTING AGAINST S. AGALACTIAE NOT ROUTINELY PERFORMED DUE TO PREDICTABILITY OF AMP/PEN/VAN SUSCEPTIBILITY. 40,000 COLONIES/mL LACTOBACILLUS SPECIES Standardized susceptibility testing for this organism is not available. Performed at [facility], [place] [address], [place], [place] [address]    Report Status 03/15/2018 FINAL  Final  Culture, blood (routine x 2)     Status: None (Preliminary result)   Collection Time: 03/13/18  1:55 AM  Result Value Ref Range Status   Specimen Description   Final    BLOOD RIGHT ARM Performed at [facility], [place] [address] [place], [place] [address]    Special Requests   Final    BOTTLES DRAWN AEROBIC AND ANAEROBIC Blood Culture adequate volume Performed at [facility] [address] [place], [place] [address]    Culture   Final    NO GROWTH 4 DAYS Performed at [facility], [place] [address], [place], [place] [address]    Report Status PENDING  Incomplete  Culture, blood (routine x 2)     Status: None (Preliminary result)   Collection Time: 03/13/18  2:10 AM  Result Value Ref Range Status   Specimen Description   Final    BLOOD LEFT HAND Performed at [facility] [address], [place], [place] [address]    Special Requests   Final    BOTTLES DRAWN AEROBIC AND ANAEROBIC Blood Culture adequate volume Performed at [facility] [address] [place], [place] [address]    Culture   Final    NO GROWTH 4 DAYS Performed at [facility], [place] [address] [place],  [address]    Report Status PENDING  Incomplete    Radiology Reports Ct Head Wo Contrast  Result Date: 03/12/2018 CLINICAL DATA:  Unwitnessed fall. EXAM: CT HEAD WITHOUT CONTRAST TECHNIQUE: Contiguous axial images were obtained from the base of the skull through the vertex without intravenous contrast. COMPARISON:  CT scan of November 09, 2017. FINDINGS: Brain: Mild chronic ischemic white matter disease is noted. No mass effect or midline shift is noted. Ventricular size is within normal limits. There is no evidence of mass lesion, hemorrhage or acute infarction. Vascular: No hyperdense vessel or unexpected calcification. Skull: Normal. Negative for fracture or focal lesion. Sinuses/Orbits: Chronic right maxillary sinusitis is noted. Other: None. IMPRESSION: Mild chronic ischemic white matter disease. No acute intracranial abnormality seen. Electronically Signed   By: Marijo Conception, M.D.   On: 03/12/2018 19:38   US Abdomen Limited  Result Date: 03/12/2018 CLINICAL DATA:  Elevated LFTs and bilirubin. EXAM: ULTRASOUND ABDOMEN LIMITED RIGHT UPPER QUADRANT COMPARISON:  CT of the abdomen and pelvis performed 11/24/2016 FINDINGS: Gallbladder: No gallstones or wall thickening visualized. No sonographic Murphy sign noted by sonographer. Common bile duct: Diameter: 0.5 cm, within normal limits in caliber. Liver: No focal lesion identified. Within normal limits in parenchymal echogenicity. Portal vein is patent on color Doppler imaging with normal direction of blood flow towards the liver. IMPRESSION: Unremarkable ultrasound of the right upper quadrant. Electronically Signed   By: Garald Balding M.D.   On: 03/12/2018 22:06    Time Spent in minutes  20   Bralon Antkowiak M.D on 03/17/2018 at 4:20 PM  Between 7am to 7pm - Pager - [phone]  After 7pm go to [email or web address] -  password [facility]  [facility] -  Office  [phone]

## 2018-03-17 NOTE — Progress Notes (Signed)
Patient still awaiting insurance authorization from [facility]   Kingsley Spittle, [place]  [phone]

## 2018-03-17 NOTE — Progress Notes (Signed)
Occupational Therapy Treatment Patient Details Name: Kimberly Mclean MRN: [id number] DOB: 04-Oct-1940 Today's Date: 03/17/2018    History of present illness  Kimberly Mclean is a 77 y.o. female with history of dementia frequent falls hypertension was brought to the ER after patient's family found the patient was on the floor. Pt was found to have rhabdomyolysis.   OT comments  Pt sitting EOB upon OT arrival with bed alarm going off.  Attempted tp have pt get to chair but pt refused and continued to sit Edge of bed and eventually agreed to lie down, Bed alarm on  Follow Up Recommendations  SNF;Supervision/Assistance - 24 hour    Equipment Recommendations  Other (comment)(TBD at next venue)    Recommendations for Other Services      Precautions / Restrictions Restrictions Weight Bearing Restrictions: No       Mobility Bed Mobility Overal bed mobility: Needs Assistance Bed Mobility: Supine to Sit;Sit to Supine     Supine to sit: Min assist;HOB elevated(trunk) Sit to supine: Min assist(legs)      Transfers                 General transfer comment: refused     Balance Overall balance assessment: Needs assistance Sitting-balance support: No upper extremity supported;Feet supported Sitting balance-Leahy Scale: Fair                                     ADL either performed or assessed with clinical judgement   ADL Overall ADL's : Needs assistance/impaired     Grooming: Sitting;Wash/dry face;Set up                                 General ADL Comments: Pt sitting EOB and did agree to wash face.  Pt refused to stand or get to the chair     Vision Patient Visual Report: No change from baseline     Perception     Praxis      Cognition Arousal/Alertness: Awake/alert Behavior During Therapy: Restless;Impulsive Overall Cognitive Status: No family/caregiver present to determine baseline cognitive functioning(per chart h/o  dementia) Area of Impairment: Orientation;Following commands;Safety/judgement;Problem solving                 Orientation Level: (knew she was a [facility], knew her name and BD, but not today's date)     Following Commands: Follows one step commands consistently Safety/Judgement: Decreased awareness of safety;Decreased awareness of deficits   Problem Solving: Requires verbal cues               Frequency  Min 2X/week        Progress Toward Goals  OT Goals(current goals can now be found in the care plan section)  Progress towards OT goals: OT to reassess next treatment(limited participation this day)     Plan Discharge plan remains appropriate    Co-evaluation                 AM-PAC PT "6 Clicks" Daily Activity     Outcome Measure   Help from another person eating meals?: None Help from another person taking care of personal grooming?: A Little Help from another person toileting, which includes using toliet, bedpan, or urinal?: A Lot Help from another person bathing (including washing, rinsing, drying)?: A Lot Help from another person to  put on and taking off regular upper body clothing?: A Lot Help from another person to put on and taking off regular lower body clothing?: A Lot 6 Click Score: 15    End of Session    OT Visit Diagnosis: Unsteadiness on feet (R26.81);Repeated falls (R29.6);History of falling (Z91.81);Muscle weakness (generalized) (M62.81)   Activity Tolerance Treatment limited secondary to agitation   Patient Left in bed;with call bell/phone within reach;with bed alarm set   Nurse Communication Mobility status        Time: 4643-1427 OT Time Calculation (min): 10 min  Charges: OT General Charges $OT Visit: 1 Visit OT Treatments $Self Care/Home Management : 8-22 mins  Kari Baars, [place] [phone] Office- [phone], Edwena Felty D 03/17/2018, 6:51  PM

## 2018-03-17 NOTE — Progress Notes (Signed)
PT Cancellation Note  Patient Details Name: Kimberly Mclean MRN: [id number] DOB: [birth year]   Cancelled Treatment:     pt declined any offer to get OOB (bathroom, a walk, recliner, a beverage)  "NO I just want to rest" Pt has been evaluated with rec for SNF   Rica Koyanagi  PTA Acute  Rehabilitation Services Pager      [phone] Office      [phone]

## 2018-03-18 LAB — CULTURE, BLOOD (ROUTINE X 2)
Culture: NO GROWTH
Culture: NO GROWTH
Special Requests: ADEQUATE
Special Requests: ADEQUATE

## 2018-03-18 NOTE — Progress Notes (Signed)
Patient seen and examined.  Discharge summary from 11/12 updated.  She is ready for discharge to [facility].

## 2018-03-18 NOTE — Progress Notes (Signed)
Report called to Charlene at [facility]. 

## 2018-03-18 NOTE — Progress Notes (Addendum)
12:14: CSW spoke with son, [name], and notified him of discharge to [facility]- son will completed paperwork with facility in the AM.   11:42: Patient is set to discharge to [facility] today. Left voicemail to notify son, [name]. Discharge packet given to RN, Shanon Brow. [facility] called for transport.    CSW received call from [facility]- facility has received insurance authorization and they are able to accept patient today 11/14 if stable to DC. Patient will just need updated DC summary- MD notified.   Kingsley Spittle, [place]  [phone]

## 2018-06-24 ENCOUNTER — Encounter: Payer: Self-pay | Admitting: Family

## 2018-08-29 ENCOUNTER — Encounter: Payer: Self-pay | Admitting: Nurse Practitioner

## 2018-09-13 ENCOUNTER — Encounter: Payer: Self-pay | Admitting: Family

## 2018-09-13 ENCOUNTER — Ambulatory Visit: Payer: Self-pay

## 2018-09-14 ENCOUNTER — Encounter: Payer: Medicare Other | Admitting: Family

## 2018-09-14 ENCOUNTER — Other Ambulatory Visit: Payer: Self-pay

## 2018-09-20 ENCOUNTER — Encounter: Payer: Self-pay | Admitting: Gastroenterology

## 2018-12-25 IMAGING — CT CT HEAD W/O CM
3 series · 16 of 47 positions shown, 19 images · non-contrast
Comparison: CT scan of November 09, 2017.

CLINICAL DATA: Unwitnessed fall.

EXAM:
CT HEAD WITHOUT CONTRAST
TECHNIQUE: Contiguous axial images were obtained from the base of the skull
through the vertex without intravenous contrast.

[Series 2: head wo · axial · 0.47mm/px · z∈[-103,+27]mm · 10 of 32 slices shown, 13 images]
[im 3/32  brain]
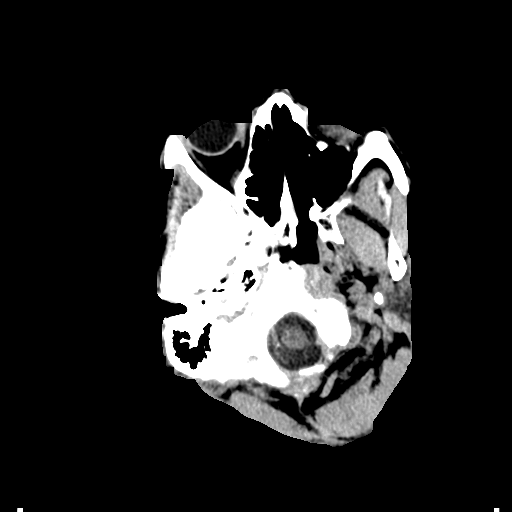
[im 3/32  bone]
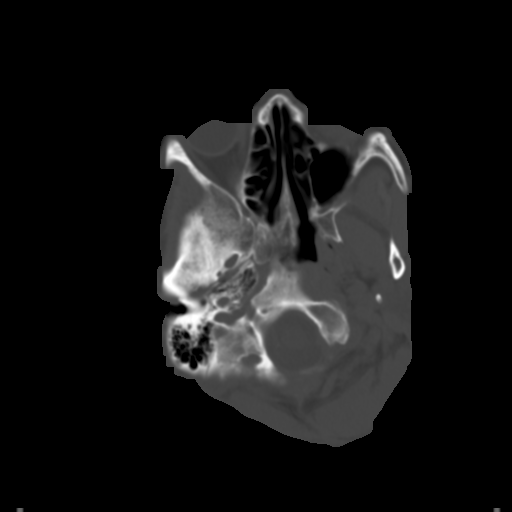
[im 6/32  brain]
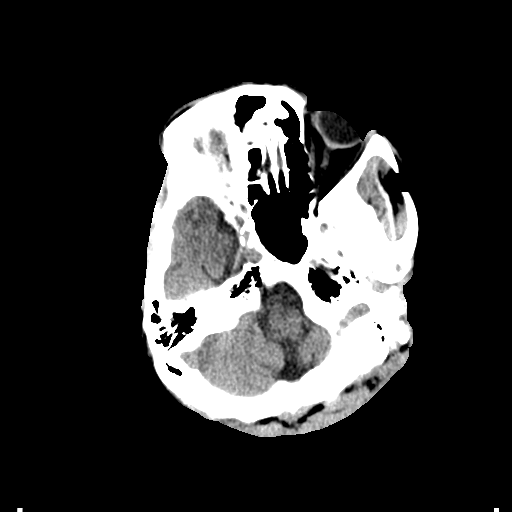
[im 9/32  brain]
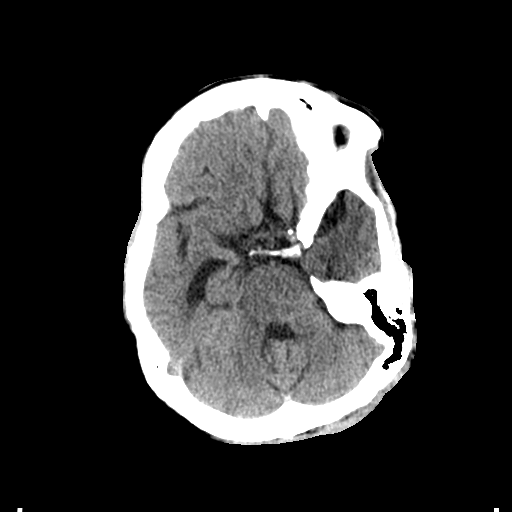
[im 11/32  brain]
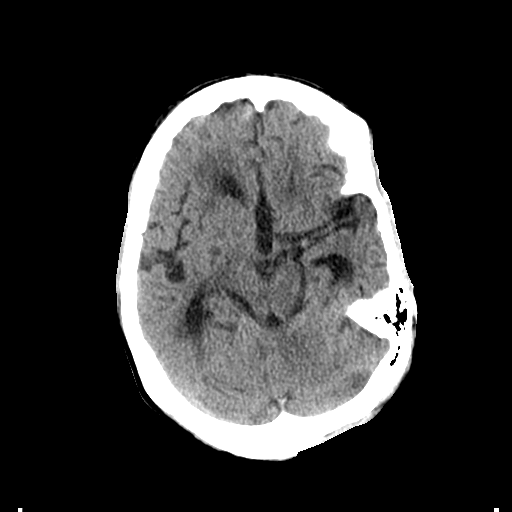
[im 14/32  brain]
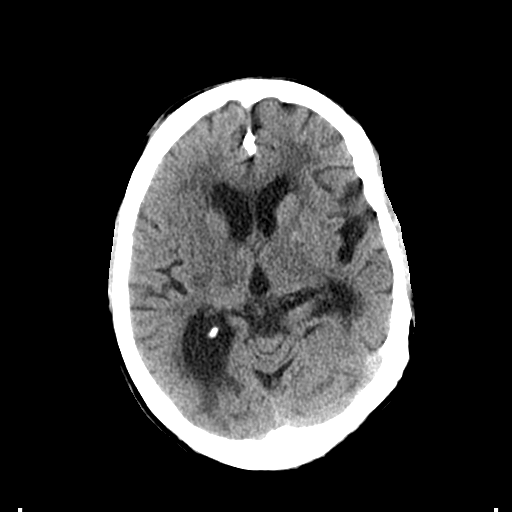
[im 14/32  bone]
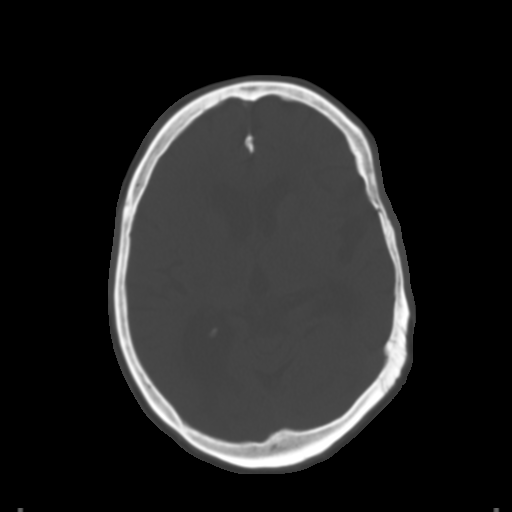
[im 18/32  brain]
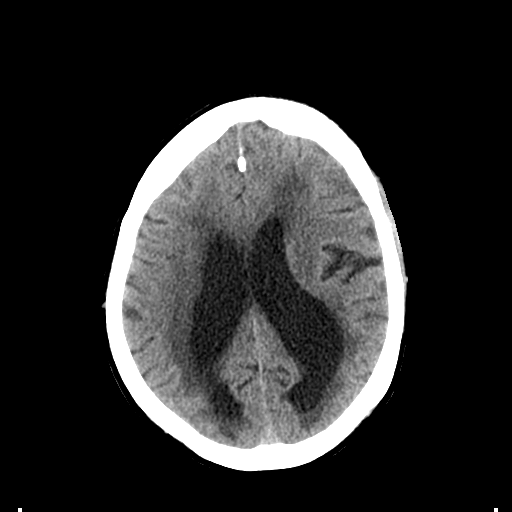
[im 21/32  brain]
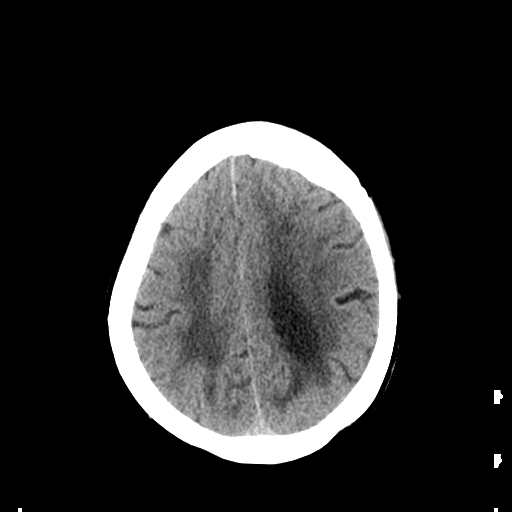
[im 24/32  brain]
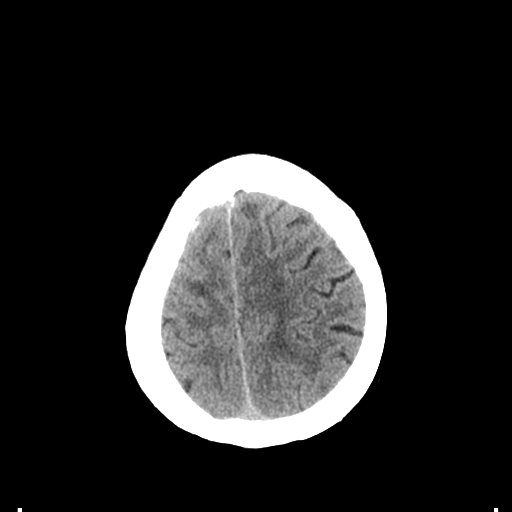
[im 26/32  brain]
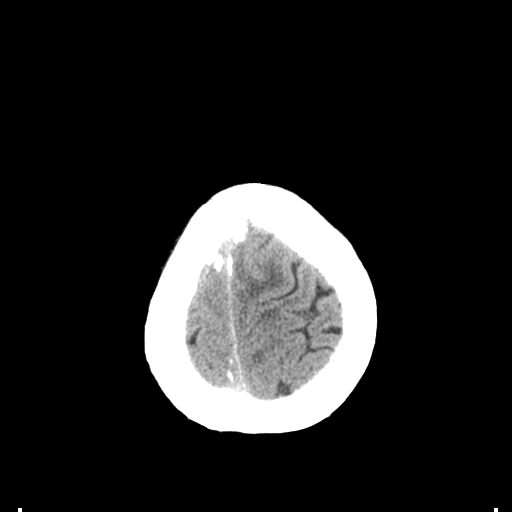
[im 26/32  bone]
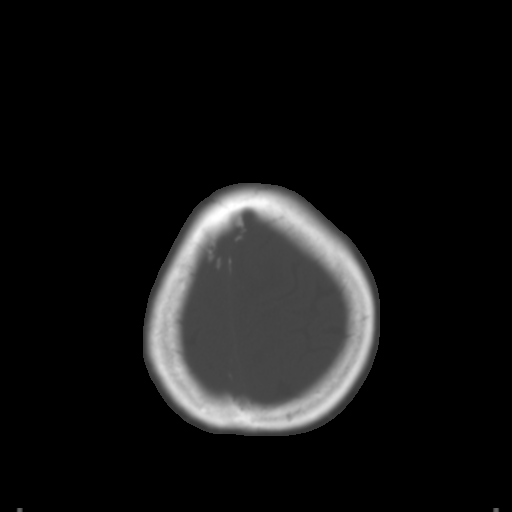
[im 29/32  brain]
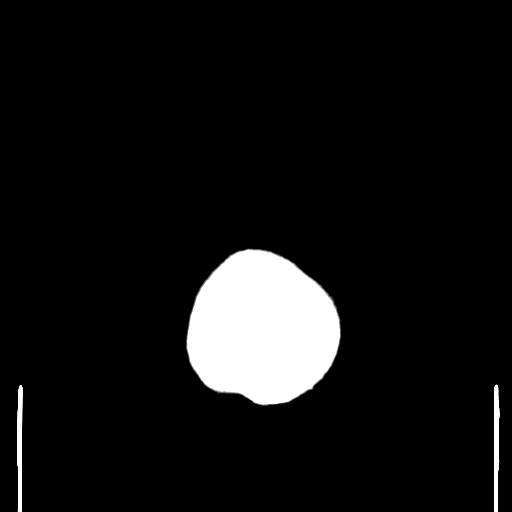

[Series 5: coronal soft tissue · coronal · 0.31mm/px · 3 of 66 slices shown]
[im 22/66  brain]
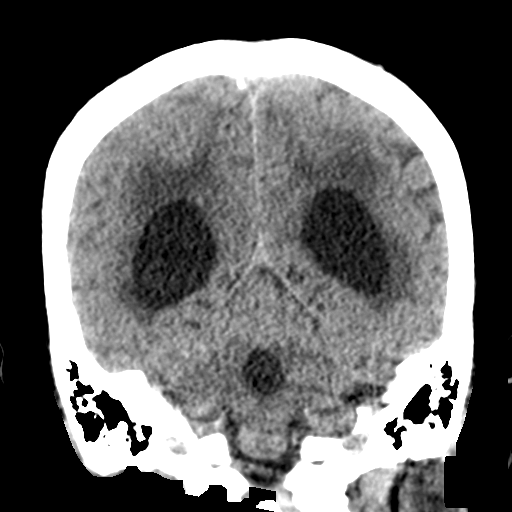
[im 29/66  brain]
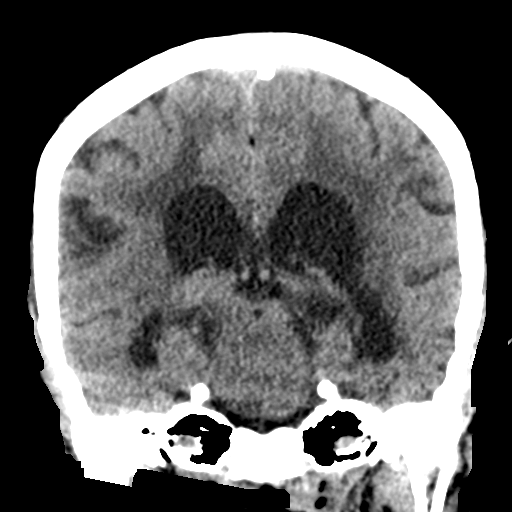
[im 37/66  brain]
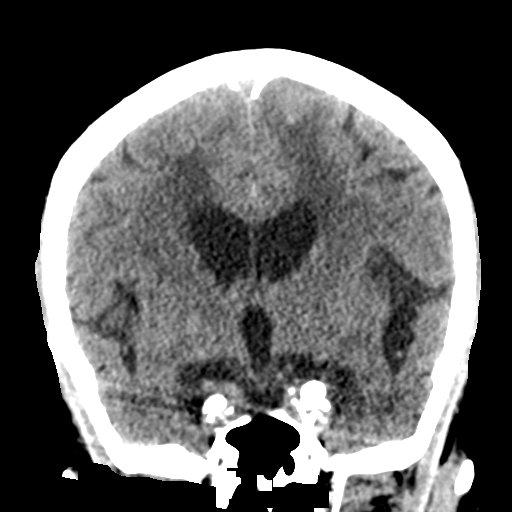

[Series 6: sagittal soft tissue · sagittal · 0.31mm/px · 3 of 52 slices shown]
[im 21/52  brain]
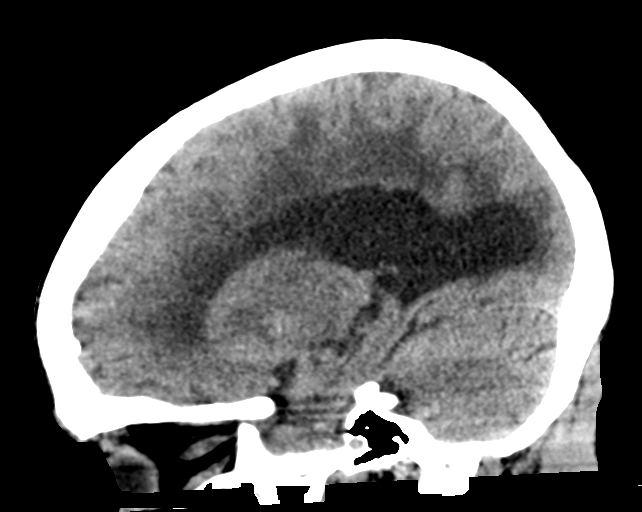
[im 26/52  brain]
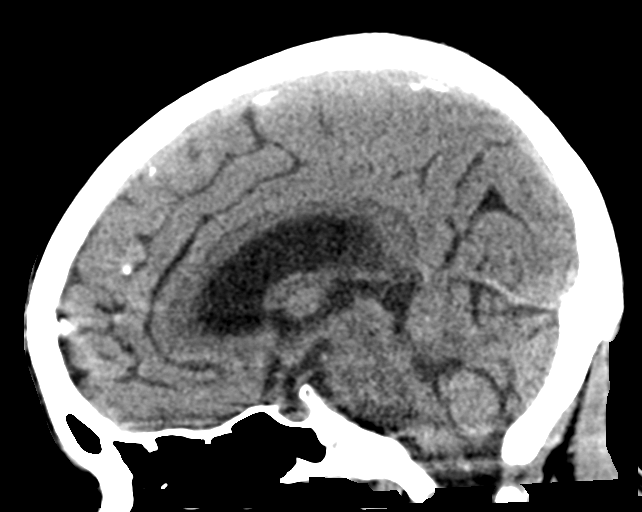
[im 31/52  brain]
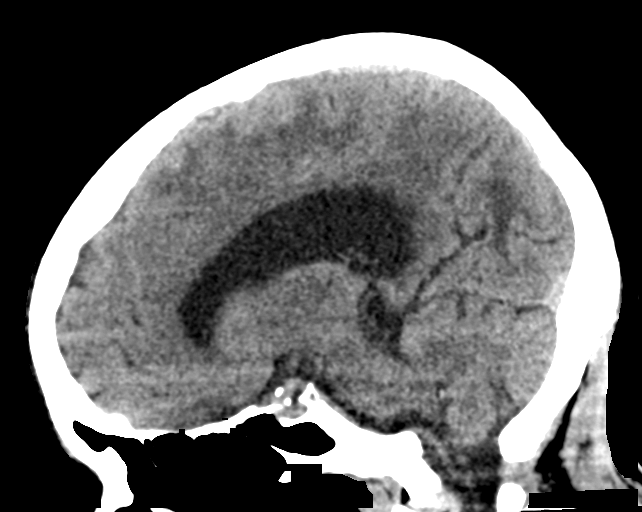

[16 of 47 positions shown; findings below may reference images not displayed]

FINDINGS: Brain: Mild chronic ischemic white matter disease is noted. No mass
effect or midline shift is noted. Ventricular size is within normal
limits. There is no evidence of mass lesion, hemorrhage or acute
infarction.

Vascular: No hyperdense vessel or unexpected calcification.

Skull: Normal. Negative for fracture or focal lesion.

Sinuses/Orbits: Chronic right maxillary sinusitis is noted.

Other: None.
IMPRESSION: Mild chronic ischemic white matter disease. No acute intracranial
abnormality seen.

## 2019-01-04 DEATH — deceased
